# Patient Record
Sex: Male | Born: 1990 | Race: White | Hispanic: No | Marital: Married | Smoking: Current every day smoker
Health system: Southern US, Community
[De-identification: ages and names within clinical notes are randomized; demographics above are authoritative.]

## PROBLEM LIST (undated history)

## (undated) DIAGNOSIS — F419 Anxiety disorder, unspecified: Secondary | ICD-10-CM

## (undated) DIAGNOSIS — F319 Bipolar disorder, unspecified: Secondary | ICD-10-CM

## (undated) DIAGNOSIS — F1910 Other psychoactive substance abuse, uncomplicated: Secondary | ICD-10-CM

## (undated) HISTORY — PX: NO PAST SURGERIES: SHX2092

---
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
## 2009-01-17 ENCOUNTER — Emergency Department: Payer: Self-pay | Admitting: Emergency Medicine

## 2012-06-06 ENCOUNTER — Emergency Department: Payer: Self-pay | Admitting: Emergency Medicine

## 2012-06-06 LAB — CBC
HCT: 41 % (ref 40.0–52.0)
HGB: 14.1 g/dL (ref 13.0–18.0)
HGB: 14.1 g/dL (ref 13.0–18.0)
MCH: 30.2 pg (ref 26.0–34.0)
MCH: 31.2 pg (ref 26.0–34.0)
MCHC: 33.2 g/dL (ref 32.0–36.0)
MCHC: 34.4 g/dL (ref 32.0–36.0)
MCV: 91 fL (ref 80–100)
MCV: 91 fL (ref 80–100)
Platelet: 151 10*3/uL (ref 150–440)
Platelet: 147 10*3/uL — ABNORMAL LOW (ref 150–440)
RBC: 4.68 10*6/uL (ref 4.40–5.90)
RDW: 13.2 % (ref 11.5–14.5)
WBC: 8.5 10*3/uL (ref 3.8–10.6)
WBC: 7.3 10*3/uL (ref 3.8–10.6)

## 2012-06-06 LAB — COMPREHENSIVE METABOLIC PANEL
Alkaline Phosphatase: 124 U/L (ref 50–136)
Anion Gap: 7 (ref 7–16)
Anion Gap: 6 — ABNORMAL LOW (ref 7–16)
BUN: 11 mg/dL (ref 7–18)
Bilirubin,Total: 0.3 mg/dL (ref 0.2–1.0)
Calcium, Total: 8.6 mg/dL (ref 8.5–10.1)
Chloride: 102 mmol/L (ref 98–107)
Chloride: 104 mmol/L (ref 98–107)
Co2: 29 mmol/L (ref 21–32)
Co2: 30 mmol/L (ref 21–32)
Creatinine: 0.79 mg/dL (ref 0.60–1.30)
Creatinine: 0.7 mg/dL (ref 0.60–1.30)
EGFR (African American): >60
EGFR (Non-African Amer.): >60
Glucose: 102 mg/dL — ABNORMAL HIGH (ref 65–99)
Osmolality: 278 (ref 275–301)
Potassium: 3.3 mmol/L — ABNORMAL LOW (ref 3.5–5.1)
Potassium: 3.7 mmol/L (ref 3.5–5.1)
SGOT(AST): 33 U/L (ref 15–37)
SGOT(AST): 32 U/L (ref 15–37)
SGPT (ALT): 40 U/L (ref 12–78)
SGPT (ALT): 40 U/L (ref 12–78)
Sodium: 138 mmol/L (ref 136–145)
Sodium: 140 mmol/L (ref 136–145)
Total Protein: 7.1 g/dL (ref 6.4–8.2)

## 2012-06-06 LAB — URINALYSIS, COMPLETE
Bilirubin,UR: NEGATIVE
Blood: NEGATIVE
Glucose,UR: NEGATIVE mg/dL (ref 0–75)
Ketone: NEGATIVE
Nitrite: NEGATIVE
Ph: 6 (ref 4.5–8.0)
Ph: 6 (ref 4.5–8.0)
Protein: NEGATIVE
RBC,UR: 5 /HPF (ref 0–5)
RBC,UR: 1 /HPF (ref 0–5)
Specific Gravity: 1.018 (ref 1.003–1.030)
Specific Gravity: 1.012 (ref 1.003–1.030)
Squamous Epithelial: NONE SEEN
Squamous Epithelial: 2
WBC UR: 3 /HPF (ref 0–5)

## 2012-06-06 LAB — DRUG SCREEN, URINE
Amphetamines, Ur Screen: NEGATIVE (ref ?–1000)
Benzodiazepine, Ur Scrn: POSITIVE (ref ?–200)
Cannabinoid 50 Ng, Ur ~~LOC~~: POSITIVE (ref ?–50)
Cocaine Metabolite,Ur ~~LOC~~: NEGATIVE (ref ?–300)
MDMA (Ecstasy)Ur Screen: NEGATIVE (ref ?–500)
Methadone, Ur Screen: NEGATIVE (ref ?–300)
Phencyclidine (PCP) Ur S: NEGATIVE (ref ?–25)
Tricyclic, Ur Screen: NEGATIVE (ref ?–1000)

## 2012-06-06 LAB — ETHANOL
Ethanol %: <0.003 % (ref 0.000–0.080)
Ethanol %: <0.003 % (ref 0.000–0.080)
Ethanol: <3 mg/dL

## 2012-06-06 LAB — SALICYLATE LEVEL: Salicylates, Serum: 3.1 mg/dL — ABNORMAL HIGH

## 2012-06-06 LAB — TSH: Thyroid Stimulating Horm: 2.88 u[IU]/mL

## 2012-11-21 ENCOUNTER — Emergency Department: Payer: Self-pay | Admitting: Emergency Medicine

## 2013-02-12 ENCOUNTER — Emergency Department: Payer: Self-pay | Admitting: Emergency Medicine

## 2013-02-12 LAB — COMPREHENSIVE METABOLIC PANEL
Albumin: 4.3 g/dL (ref 3.4–5.0)
Alkaline Phosphatase: 114 U/L (ref 50–136)
Anion Gap: 3 — ABNORMAL LOW (ref 7–16)
BUN: 15 mg/dL (ref 7–18)
Bilirubin,Total: 0.5 mg/dL (ref 0.2–1.0)
Calcium, Total: 9.2 mg/dL (ref 8.5–10.1)
Chloride: 102 mmol/L (ref 98–107)
Creatinine: 0.87 mg/dL (ref 0.60–1.30)
EGFR (African American): >60
EGFR (Non-African Amer.): >60
Osmolality: 273 (ref 275–301)
SGPT (ALT): 32 U/L (ref 12–78)
Sodium: 136 mmol/L (ref 136–145)

## 2013-02-12 LAB — CBC
HGB: 15.9 g/dL (ref 13.0–18.0)
RBC: 5.08 10*6/uL (ref 4.40–5.90)
WBC: 8.2 10*3/uL (ref 3.8–10.6)

## 2013-02-12 LAB — URINALYSIS, COMPLETE
Blood: NEGATIVE
Nitrite: NEGATIVE
Ph: 5 (ref 4.5–8.0)
Protein: 30
Squamous Epithelial: 1

## 2013-02-12 LAB — DRUG SCREEN, URINE
Amphetamines, Ur Screen: POSITIVE (ref ?–1000)
Barbiturates, Ur Screen: NEGATIVE (ref ?–200)
Cannabinoid 50 Ng, Ur ~~LOC~~: POSITIVE (ref ?–50)
MDMA (Ecstasy)Ur Screen: NEGATIVE (ref ?–500)
Methadone, Ur Screen: NEGATIVE (ref ?–300)
Opiate, Ur Screen: POSITIVE (ref ?–300)

## 2013-02-12 LAB — ETHANOL: Ethanol %: <0.003 % (ref 0.000–0.080)

## 2013-02-13 LAB — SALICYLATE LEVEL: Salicylates, Serum: 3.2 mg/dL — ABNORMAL HIGH

## 2013-02-13 LAB — TSH: Thyroid Stimulating Horm: 6.41 u[IU]/mL — ABNORMAL HIGH

## 2013-02-13 LAB — ACETAMINOPHEN LEVEL: Acetaminophen: <2 ug/mL

## 2013-02-22 ENCOUNTER — Emergency Department: Payer: Self-pay | Admitting: Emergency Medicine

## 2013-12-17 ENCOUNTER — Ambulatory Visit: Payer: Self-pay | Admitting: Emergency Medicine

## 2014-06-13 ENCOUNTER — Emergency Department: Payer: Self-pay | Admitting: Emergency Medicine
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
## 2014-07-21 NOTE — Consult Note (Signed)
Brief Consult Note: Diagnosis: MDD, Polysubstance Dependence.   Patient was seen by consultant.   Recommend further assessment or treatment.   Comments: Pt seen for follow up. He admitted here due to snorting Xanax and opioids. He is IVC. He was following [facility] and getting Abilify. He stated that he is currently on Probation for MJ possesion. He stated that he went to [facility] for rehab in the past. he is willing for treatment again. Denied SI/HI or plans.   Plan; Pt is awaiting placement at [facility].  Pt will be started on Prozac 20mg  for depression.  Will continue to monitor.  Electronic Signatures: Rhunette CroftFaheem, Daran Favaro S (MD)  (Signed 11-Mar-14 10:54)  Authored: Brief Consult Note   Last Updated: 11-Mar-14 10:54 by Rhunette CroftFaheem, Royer Cristobal S (MD)

## 2014-07-30 NOTE — Consult Note (Signed)
Brief Consult Note: Diagnosis: anxiety disorder nos.   Patient was seen by consultant.   Consult note dictated.   Discussed with Attending MD.   Comments: Psychiatry: Patient seen and chart reviewed. Patient petitioned by family who claim he expressed suicidal ideation. Patient absolutely denies this and denies any feeling of depression. Expresses positive feelings about his life. Denies abuse of drugs. Does not meet commitment criteria.  Electronic Signatures: Audery Amellapacs, Tyneshia Stivers T (MD)  (Signed 15-Mar-16 15:56)  Authored: Brief Consult Note   Last Updated: 15-Mar-16 15:56 by Audery Amellapacs, Baileigh Modisette T (MD)

## 2014-07-30 NOTE — Consult Note (Signed)
PATIENT NAME:  Benjamin Tyler, Benjamin Tyler MR#:  [id number] DATE OF BIRTH:  10/12/1990  DATE OF CONSULTATION:  06/13/2014  CONSULTING PHYSICIAN:  Audery Amel, MD  IDENTIFYING INFORMATION AND REASON FOR CONSULTATION:  A 24 year old man who was brought in under involuntary petition filed by his family claiming that he was abusing his medication.   CHIEF COMPLAINT:  "I just saw my psychiatrist."   HISTORY OF PRESENT ILLNESS:  Information from the patient and the chart. Commitment paperwork alleges that he has been abusing his psychiatric medicine, specifically his Xanax. It also alleges that he had made suicidal statements. The patient flat-out denies both of these things. He says that his mood is good and has been fine in the recent past. He says that he has been sleeping fine and not having significant anxiety problems. He is taking several psychiatric medicines and says he uses all of them appropriately. He denies abusing any of his medicine. He denies abusing alcohol or drugs. He totally denies many suicidal ideation.   PAST PSYCHIATRIC HISTORY:  The patient has been in the Emergency Room in the past for substance abuse problems. He has a history of substance abuse and also has been involved with a Suboxone clinic in the past. It does not appear that he has ever made a suicide attempt. He is currently seeing a psychiatrist named Dr. Chestine Spore, who is treating him with Remeron, Wellbutrin, Xanax, and recently started him on Seroquel.    SOCIAL HISTORY:  The patient lives with a girlfriend. 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 He will not go into much more detail than that about it.   PAST MEDICAL HISTORY:  Denies any ongoing medical problems.   FAMILY HISTORY:  Denies any family history of mental health or substance abuse problems.   SUBSTANCE ABUSE HISTORY:  Documented past history of abuse of opiates and involvement with a Suboxone clinic. The patient denies that he is abusing his Xanax. He  denies that he is drinking currently. He denies any other substance abuse.   REVIEW OF SYSTEMS:  Essentially all negative. He denies depression. He denies suicidal or homicidal ideation. He denies hallucinations. He denies pain. No physical symptoms.   MENTAL STATUS EXAMINATION:  Reasonably well-groomed young man who looks his stated age. Cooperative with the interview, although very clearly making an effort to convince me that he has no problem. Eye contact is good. Psychomotor activity is normal. Speech is a little bit earnest and intense, but not pressured. It is also slightly slurred in a way that sounds like he may be at least partially intoxicated. He is alert and oriented x 4, however. He can remember 3 words immediately and at 3 minutes. He denies suicidal or homicidal ideation. He denies auditory or visual hallucinations. Judgment and insight about his substance abuse problem is questionable. Intelligence appears to be normal.   LABORATORY RESULTS:  Drug screen is positive for cannabis and benzodiazepines. Urinalysis is unremarkable. Alcohol level is negative. Acetaminophen and salicylates are negative. Chemistry panel is unremarkable. Low platelet count at 137,000. No other abnormalities on the blood count.   VITAL SIGNS:  Blood pressure 149/95, respirations 16, pulse 102, temperature 98.3.   ASSESSMENT:  A 24 year old man with a history of substance abuse problems. Family alleged that he is abusing his medication. What I found on looking up his medicine in the controlled substance database, it appears that his benzodiazepine dose has been doubled in the last month. He had  been receiving Xanax 1 mg 3 times a day for several months, but his most recent prescription was for 2 mg 3 times a day. He does seem slightly intoxicated to me in a way that might be benzodiazepines, but he is not delirious and there is no sign of any acute dangerousness. The patient does not meet commitment criteria.    TREATMENT PLAN:  Discontinue involuntary commitment. Psychoeducation and counseling completed about the dangers of benzodiazepine abuse and the mixture of benzodiazepines with other drugs. Education completed about the addictive qualities of benzodiazepines. The patient was encouraged to talk to his psychiatrist about this. No further treatment recommendations or referral. No medications written.   DIAGNOSIS, PRINCIPAL AND PRIMARY:  AXIS I:  Opiate abuse, moderate, in remission.   SECONDARY DIAGNOSES: AXIS I:  Rule out benzodiazepine abuse; cannabis abuse, mild to moderate.   AXIS II:  Deferred.   AXIS III:  No diagnosis.    ____________________________ Audery AmelJohn T. Clapacs, MD jtc:nb D: 06/13/2014 22:09:35 ET T: 06/13/2014 22:38:47 ET JOB#: [id number]  cc: Audery AmelJohn T. Clapacs, MD, <Dictator> Audery AmelJOHN T CLAPACS MD ELECTRONICALLY SIGNED 06/16/2014 10:13

## 2015-01-22 ENCOUNTER — Encounter: Payer: Self-pay | Admitting: Emergency Medicine

## 2015-01-22 ENCOUNTER — Emergency Department: Payer: BLUE CROSS/BLUE SHIELD

## 2015-01-22 ENCOUNTER — Emergency Department
Admission: EM | Admit: 2015-01-22 | Discharge: 2015-01-22 | Disposition: A | Discharge status: Home / Self Care | Payer: BLUE CROSS/BLUE SHIELD | Attending: Emergency Medicine | Admitting: Emergency Medicine

## 2015-01-22 DIAGNOSIS — F17200 Nicotine dependence, unspecified, uncomplicated: Secondary | ICD-10-CM | POA: Diagnosis not present

## 2015-01-22 DIAGNOSIS — S00502A Unspecified superficial injury of oral cavity, initial encounter: Secondary | ICD-10-CM | POA: Diagnosis not present

## 2015-01-22 DIAGNOSIS — W1839XA Other fall on same level, initial encounter: Secondary | ICD-10-CM | POA: Diagnosis not present

## 2015-01-22 DIAGNOSIS — R569 Unspecified convulsions: Secondary | ICD-10-CM | POA: Insufficient documentation

## 2015-01-22 DIAGNOSIS — Z79899 Other long term (current) drug therapy: Secondary | ICD-10-CM | POA: Diagnosis not present

## 2015-01-22 DIAGNOSIS — Y9389 Activity, other specified: Secondary | ICD-10-CM | POA: Diagnosis not present

## 2015-01-22 DIAGNOSIS — Y990 Civilian activity done for income or pay: Secondary | ICD-10-CM | POA: Insufficient documentation

## 2015-01-22 DIAGNOSIS — S0990XA Unspecified injury of head, initial encounter: Secondary | ICD-10-CM | POA: Insufficient documentation

## 2015-01-22 DIAGNOSIS — Y9289 Other specified places as the place of occurrence of the external cause: Secondary | ICD-10-CM | POA: Insufficient documentation

## 2015-01-22 LAB — URINE DRUG SCREEN, QUALITATIVE (ARMC ONLY)
Amphetamines, Ur Screen: NOT DETECTED — AB
BARBITURATES, UR SCREEN: NOT DETECTED — AB
BENZODIAZEPINE, UR SCRN: POSITIVE — AB
CANNABINOID 50 NG, UR ~~LOC~~: POSITIVE — AB
COCAINE METABOLITE, UR ~~LOC~~: NOT DETECTED — AB
MDMA (Ecstasy)Ur Screen: NOT DETECTED — AB
Methadone Scn, Ur: NOT DETECTED — AB
OPIATE, UR SCREEN: NOT DETECTED — AB
Phencyclidine (PCP) Ur S: NOT DETECTED — AB
TRICYCLIC, UR SCREEN: POSITIVE — AB

## 2015-01-22 LAB — URINALYSIS COMPLETE WITH MICROSCOPIC (ARMC ONLY)
BILIRUBIN URINE: NEGATIVE
CELLULAR CAST UA: 2
Glucose, UA: NEGATIVE mg/dL
HGB URINE DIPSTICK: NEGATIVE
KETONES UR: NEGATIVE mg/dL
Leukocytes, UA: NEGATIVE
Nitrite: NEGATIVE
PH: 5 (ref 5.0–8.0)
Protein, ur: NEGATIVE mg/dL
SPECIFIC GRAVITY, URINE: 1.014 (ref 1.005–1.030)
SQUAMOUS EPITHELIAL / LPF: NONE SEEN

## 2015-01-22 LAB — CBC WITH DIFFERENTIAL/PLATELET
Basophils Absolute: 0 10*3/uL (ref 0–0.1)
Basophils Relative: 1 %
EOS ABS: 0.3 10*3/uL (ref 0–0.7)
Eosinophils Relative: 4 %
HEMATOCRIT: 42.6 % (ref 40.0–52.0)
HEMOGLOBIN: 14.6 g/dL (ref 13.0–18.0)
LYMPHS ABS: 2.4 10*3/uL (ref 1.0–3.6)
Lymphocytes Relative: 31 %
MCH: 30.8 pg (ref 26.0–34.0)
MCHC: 34.3 g/dL (ref 32.0–36.0)
MCV: 89.7 fL (ref 80.0–100.0)
MONO ABS: 0.4 10*3/uL (ref 0.2–1.0)
MONOS PCT: 6 %
NEUTROS PCT: 58 %
Neutro Abs: 4.5 10*3/uL (ref 1.4–6.5)
Platelets: 189 10*3/uL (ref 150–440)
RBC: 4.75 MIL/uL (ref 4.40–5.90)
RDW: 13.4 % (ref 11.5–14.5)
WBC: 7.7 10*3/uL (ref 3.8–10.6)

## 2015-01-22 LAB — BASIC METABOLIC PANEL
Anion gap: 9 (ref 5–15)
BUN: 10 mg/dL (ref 6–20)
CALCIUM: 9.2 mg/dL (ref 8.9–10.3)
CHLORIDE: 105 mmol/L (ref 101–111)
CO2: 26 mmol/L (ref 22–32)
CREATININE: 0.84 mg/dL (ref 0.61–1.24)
GFR calc non Af Amer: >60 mL/min (ref >60)
GLUCOSE: 105 mg/dL — AB (ref 65–99)
Potassium: 3.6 mmol/L (ref 3.5–5.1)
Sodium: 140 mmol/L (ref 135–145)

## 2015-01-22 LAB — MAGNESIUM: Magnesium: 2 mg/dL (ref 1.7–2.4)

## 2015-01-22 NOTE — ED Notes (Signed)
Called lab to inquire about the UDS that was collected at 16:59. Lab reports the urine is still on the analyzer and results should be available very soon. Huel CoteQuigley, MD made aware.

## 2015-01-22 NOTE — ED Provider Notes (Signed)
Time Seen: Approximately 1718 I have reviewed the triage notes  Chief Complaint: Headache   History of Present Illness: Benjamin Tyler is a 24 y.o. male who states he felt fine throughout most the day. He was working outside on a machine at his grandparent's home. Patient apparently was witnessed to fall backwards onto the ground and had some "" shaking "". Patient states he doesn't feel he was unconscious for a long period of time. He denies any neck pain though has bilateral temporal pain type headache. He describes the headache is mild. He did bite his tongue and has some mild crush injuries on both sides of his tongue. He did not urinate or defecate on himself. He states he smoked marijuana couple of days ago but no other illicit drugs. Patient denies any alcohol. He has a history of possible medication abuse with his prescription meds. States he's been taking his medications as prescribed. He denies any focal weakness or any other discomfort across the shoulder or hips.   History reviewed. No pertinent past medical history. No history of seizure disorder There are no active problems to display for this patient.   History reviewed. No pertinent past surgical history.  History reviewed. No pertinent past surgical history.  Current Outpatient Rx  Name  Route  Sig  Dispense  Refill  . ALPRAZolam (XANAX) 0.25 MG tablet   Oral   Take 2 mg by mouth 3 (three) times daily.         Marland Kitchen buPROPion (WELLBUTRIN XL) 300 MG 24 hr tablet   Oral   Take 300 mg by mouth daily.           Allergies:  Review of patient's allergies indicates no known allergies.  Family History: No family history on file.  Social History: Social History  Substance Use Topics  . Smoking status: Current Every Day Smoker -- 1.50 packs/day  . Smokeless tobacco: Never Used  . Alcohol Use: No     Review of Systems:   10 point review of systems was performed and was otherwise negative:  Constitutional:  No fever. Patient states he feels dehydrated. Eyes: No visual disturbances ENT: No sore throat, ear pain Cardiac: No chest pain Respiratory: No shortness of breath, wheezing, or stridor Abdomen: No abdominal pain, no vomiting, No diarrhea Endocrine: No weight loss, No night sweats Extremities: No peripheral edema, cyanosis Skin: No rashes, easy bruising Neurologic: No focal weakness, trouble with speech or swollowing Urologic: No dysuria, Hematuria, or urinary frequency *  Physical Exam:  ED Triage Vitals  Enc Vitals Group     BP 01/22/15 1702 132/94 mmHg     Pulse Rate 01/22/15 1702 102     Resp 01/22/15 1702 18     Temp --      Temp src --      SpO2 01/22/15 1702 100 %     Weight 01/22/15 1702 150 lb (68.04 kg)     Height 01/22/15 1702  (1.854 m)     Head Cir --      Peak Flow --      Pain Score 01/22/15 1704 10     Pain Loc --      Pain Edu? --      Excl. in GC? --     General: Awake , Alert , and Oriented times 3; GCS 15 Head: Normal cephalic , atraumatic Eyes: Pupils equal , round, reactive to light Nose/Throat: No nasal drainage, patent upper airway without erythema  or exudate.  Neck: Supple, Full range of motion, No anterior adenopathy or palpable thyroid masses Lungs: Clear to ascultation without wheezes , rhonchi, or rales Heart: Regular rate, regular rhythm without murmurs , gallops , or rubs Abdomen: Soft, non tender without rebound, guarding , or rigidity; bowel sounds positive and symmetric in all 4 quadrants. No organomegaly .        Extremities: 2 plus symmetric pulses. No edema, clubbing or cyanosis Neurologic: normal ambulation, Motor symmetric without deficits, sensory intact Skin: warm, dry, no rashes   Labs:   All laboratory work was reviewed including any pertinent negatives or positives listed below:  Labs Reviewed  BASIC METABOLIC PANEL  CBC WITH DIFFERENTIAL/PLATELET  URINE DRUG SCREEN, QUALITATIVE ([facility] ONLY)  URINALYSIS COMPLETEWITH  MICROSCOPIC ([facility] ONLY)  MAGNESIUM    EKG:  ED ECG REPORT I, Jennye MoccasinBrian S Quigley, the attending physician, personally viewed and interpreted this ECG.  Date: 01/22/2015 EKG Time: 1710 Rate: 81 Rhythm: normal sinus rhythm QRS Axis: normal Intervals: normal ST/T Wave abnormalities: normal Conduction Disutrbances: none Narrative Interpretation: unremarkable Normal EKG   Radiology:    EXAM: CT HEAD WITHOUT CONTRAST  TECHNIQUE: Contiguous axial images were obtained from the base of the skull through the vertex without contrast.  COMPARISON: Head CT 02/12/2013  FINDINGS: No evidence for acute hemorrhage, mass lesion, midline shift, hydrocephalus or large infarct. There is extensive mucosal thickening in the ethmoid air cells. Mucosal disease in the right maxillary sinus which is incompletely evaluated. No evidence for an acute calvarial fracture.  IMPRESSION: No acute intracranial abnormality.  Paranasal sinus disease.   Electronically Signed By: Richarda OverlieAdam Henn M.D. On: 01/22/2015 18:33  I personally reviewed the radiologic studies   ED Course: Given the patient's current clinical presentation, objective findings, etc,; this appeared to be new onset seizure disorder which did not seem to be complicated nature. The patient require further outpatient assessment by a neurologist and was advised he cannot drive or operate heavy machinery that would put himself or do activities that would put himself or others in danger . We discussed further outpatient workup which make constitute an EEG, MRI, etc. They're advised to return here if patient has a repeat seizure especially one that last more than 3 minutes and/or 2 seizures without full recovery within a 30 minute span. All questions and concerns were addressed at the bedside with family members present.    Assessment:  New-onset seizure     Plan: Outpatient management Patient was advised to return immediately if  condition worsens. Patient was advised to follow up with her primary care physician or other specialized physicians involved and in their current assessment.            Jennye MoccasinBrian S Quigley, MD 01/22/15 2204
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
## 2015-01-22 NOTE — ED Notes (Signed)
AAOx3.  Skin warm and dry.  NAD 

## 2015-01-22 NOTE — Discharge Instructions (Signed)
Seizure, Adult A seizure is abnormal electrical activity in the brain. Seizures usually last from 30 seconds to 2 minutes. There are various types of seizures. Before a seizure, you may have a warning sensation (aura) that a seizure is about to occur. An aura may include the following symptoms:   Fear or anxiety.  Nausea.  Feeling like the room is spinning (vertigo).  Vision changes, such as seeing flashing lights or spots. Common symptoms during a seizure include:  A change in attention or behavior (altered mental status).  Convulsions with rhythmic jerking movements.  Drooling.  Rapid eye movements.  Grunting.  Loss of bladder and bowel control.  Bitter taste in the mouth.  Tongue biting. After a seizure, you may feel confused and sleepy. You may also have an injury resulting from convulsions during the seizure. HOME CARE INSTRUCTIONS   If you are given medicines, take them exactly as prescribed by your health care provider.  Keep all follow-up appointments as directed by your health care provider.  Do not swim or drive or engage in risky activity during which a seizure could cause further injury to you or others until your health care provider says it is OK.  Get adequate rest.  Teach friends and family what to do if you have a seizure. They should:  Lay you on the ground to prevent a fall.  Put a cushion under your head.  Loosen any tight clothing around your neck.  Turn you on your side. If vomiting occurs, this helps keep your airway clear.  Stay with you until you recover.  Know whether or not you need emergency care. SEEK IMMEDIATE MEDICAL CARE IF:  The seizure lasts longer than 5 minutes.  The seizure is severe or you do not wake up immediately after the seizure.  You have an altered mental status after the seizure.  You are having more frequent or worsening seizures. Someone should drive you to the emergency department or call local emergency  services (911 in [place] MAKE SURE YOU:  Understand these instructions.  Will watch your condition.  Will get help right away if you are not doing well or get worse.   This information is not intended to replace advice given to you by your health care provider. Make sure you discuss any questions you have with your health care provider.   Document Released: 03/14/2000 Document Revised: 04/07/2014 Document Reviewed: 10/27/2012 Elsevier Interactive Patient Education [employer]  Please return immediately if condition worsens. Please contact her primary physician or the physician you were given for referral. If you have any specialist physicians involved in her treatment and plan please also contact them. Thank you for using [facility]. Over-the-counter pain medication. Please follow-up with a neurologist and no driving or operating any machinery that would put you were others in danger if the seizure would reoccur. Please drink plenty of fluids and get enough sleep. Return to emergency department if he had 2 seizures within a 30 minute period of time especially if he did not return to normal consciousness or any seizure lasts more than 3 minutes.

## 2015-01-22 NOTE — ED Notes (Signed)
Called lab again to inquire about UDS progress. Lab reports all results are back except for one, which should take about 10 more minutes. Md made aware.

## 2015-07-23 ENCOUNTER — Encounter: Payer: Self-pay | Admitting: Emergency Medicine

## 2015-07-23 ENCOUNTER — Emergency Department
Admission: EM | Admit: 2015-07-23 | Discharge: 2015-07-23 | Disposition: A | Discharge status: Home / Self Care | Payer: BLUE CROSS/BLUE SHIELD | Attending: Emergency Medicine | Admitting: Emergency Medicine

## 2015-07-23 ENCOUNTER — Emergency Department: Payer: BLUE CROSS/BLUE SHIELD

## 2015-07-23 DIAGNOSIS — R4189 Other symptoms and signs involving cognitive functions and awareness: Secondary | ICD-10-CM

## 2015-07-23 DIAGNOSIS — R400 Somnolence: Secondary | ICD-10-CM

## 2015-07-23 DIAGNOSIS — F1290 Cannabis use, unspecified, uncomplicated: Secondary | ICD-10-CM

## 2015-07-23 DIAGNOSIS — F17200 Nicotine dependence, unspecified, uncomplicated: Secondary | ICD-10-CM | POA: Diagnosis not present

## 2015-07-23 DIAGNOSIS — F1210 Cannabis abuse, uncomplicated: Secondary | ICD-10-CM | POA: Insufficient documentation

## 2015-07-23 DIAGNOSIS — R4781 Slurred speech: Secondary | ICD-10-CM | POA: Insufficient documentation

## 2015-07-23 DIAGNOSIS — R4020 Unspecified coma: Secondary | ICD-10-CM | POA: Diagnosis not present

## 2015-07-23 LAB — URINALYSIS COMPLETE WITH MICROSCOPIC (ARMC ONLY)
BACTERIA UA: NONE SEEN
BILIRUBIN URINE: NEGATIVE
GLUCOSE, UA: NEGATIVE mg/dL
HGB URINE DIPSTICK: NEGATIVE
KETONES UR: NEGATIVE mg/dL
LEUKOCYTES UA: NEGATIVE
NITRITE: NEGATIVE
Protein, ur: NEGATIVE mg/dL
SPECIFIC GRAVITY, URINE: 1.003 — AB (ref 1.005–1.030)
Squamous Epithelial / LPF: NONE SEEN
pH: 6 (ref 5.0–8.0)

## 2015-07-23 LAB — ETHANOL: Alcohol, Ethyl (B): <5 mg/dL (ref <5)

## 2015-07-23 LAB — COMPREHENSIVE METABOLIC PANEL
ALBUMIN: 4.6 g/dL (ref 3.5–5.0)
ALT: 17 U/L (ref 17–63)
ANION GAP: 7 (ref 5–15)
AST: 22 U/L (ref 15–41)
Alkaline Phosphatase: 83 U/L (ref 38–126)
BUN: 11 mg/dL (ref 6–20)
CALCIUM: 9.5 mg/dL (ref 8.9–10.3)
CHLORIDE: 106 mmol/L (ref 101–111)
CO2: 30 mmol/L (ref 22–32)
Creatinine, Ser: 0.77 mg/dL (ref 0.61–1.24)
GFR calc Af Amer: >60 mL/min (ref >60)
GFR calc non Af Amer: >60 mL/min (ref >60)
GLUCOSE: 98 mg/dL (ref 65–99)
POTASSIUM: 3.7 mmol/L (ref 3.5–5.1)
Sodium: 143 mmol/L (ref 135–145)
Total Bilirubin: 0.4 mg/dL (ref 0.3–1.2)
Total Protein: 7.9 g/dL (ref 6.5–8.1)

## 2015-07-23 LAB — CBC
HCT: 45 % (ref 40.0–52.0)
HEMOGLOBIN: 15.2 g/dL (ref 13.0–18.0)
MCH: 30.9 pg (ref 26.0–34.0)
MCHC: 33.9 g/dL (ref 32.0–36.0)
MCV: 91.3 fL (ref 80.0–100.0)
Platelets: 188 10*3/uL (ref 150–440)
RBC: 4.93 MIL/uL (ref 4.40–5.90)
RDW: 13.4 % (ref 11.5–14.5)
WBC: 13.2 10*3/uL — ABNORMAL HIGH (ref 3.8–10.6)

## 2015-07-23 LAB — URINE DRUG SCREEN, QUALITATIVE (ARMC ONLY)
Amphetamines, Ur Screen: NOT DETECTED
BARBITURATES, UR SCREEN: NOT DETECTED
Benzodiazepine, Ur Scrn: POSITIVE — AB
COCAINE METABOLITE, UR ~~LOC~~: NOT DETECTED
Cannabinoid 50 Ng, Ur ~~LOC~~: POSITIVE — AB
MDMA (Ecstasy)Ur Screen: NOT DETECTED
METHADONE SCREEN, URINE: NOT DETECTED
Opiate, Ur Screen: NOT DETECTED
Phencyclidine (PCP) Ur S: NOT DETECTED
TRICYCLIC, UR SCREEN: NOT DETECTED

## 2015-07-23 LAB — SALICYLATE LEVEL

## 2015-07-23 LAB — ACETAMINOPHEN LEVEL

## 2015-07-23 MED ORDER — SODIUM CHLORIDE 0.9 % IV BOLUS (SEPSIS)
1000.0000 mL | Freq: Once | INTRAVENOUS | Status: AC
  Administered 2015-07-23: 1000 mL via INTRAVENOUS

## 2015-07-23 NOTE — ED Notes (Signed)
Pt refused to sign.  

## 2015-07-23 NOTE — ED Notes (Signed)
Water given to pt per Dr. Sharma CovertNorman.

## 2015-07-23 NOTE — ED Provider Notes (Signed)
[facility] Emergency Department Provider Note  ____________________________________________  Time seen: Approximately 5:39 PM  I have reviewed the triage vital signs and the nursing notes.   HISTORY  Chief Complaint Drug Overdose    HPI Benjamin ReekJustin Dean Tyler is a 25 y.o. male with a history of cocaine abuse, benzodiazepine abuse, bipolar disorder and schizophrenia brought by EMS for unresponsiveness. The patient reports that he had [place] [employer] and was sitting in his car when he had "seizure." He states that he had no preceding symptoms but then he woke up and was in the ambulance." This is just like the last MI a seizure." He was seen here 10/16 with a brief episode of unresponsiveness and some possible shaking but never followed up with neurology as recommended by the emergency physician. Is not had any other episodes since then. He reports that he is taking all of his medications as prescribed and took only 2 mg of Xanax this morning. He denies any other drug or alcohol use, he denies SI, HI or hallucinations.He denies any recent illness. At this time, he notes that he has some slurred speech which he states is unusual for him.   History reviewed. No pertinent past medical history.  There are no active problems to display for this patient.   History reviewed. No pertinent past surgical history.  Current Outpatient Rx  Name  Route  Sig  Dispense  Refill  . ALPRAZolam (XANAX) 0.25 MG tablet   Oral   Take 2 mg by mouth 3 (three) times daily.         Marland Kitchen. buPROPion (WELLBUTRIN XL) 300 MG 24 hr tablet   Oral   Take 300 mg by mouth daily.           Allergies Review of patient's allergies indicates no known allergies.  History reviewed. No pertinent family history.  Social History Social History  Substance Use Topics  . Smoking status: Current Every Day Smoker -- 1.50 packs/day  . Smokeless tobacco: Never Used  . Alcohol Use: No     Review of Systems Constitutional: No fever/chills.Positive syncope. Negative trauma. Eyes: No visual changes. No blurred or double vision. ENT: No sore throat. No congestion or rhinorrhea. Cardiovascular: Denies chest pain. Denies palpitations. Respiratory: Denies shortness of breath.  No cough. Gastrointestinal: No abdominal pain.  No nausea, no vomiting.  No diarrhea.  No constipation. Genitourinary: Negative for dysuria. Musculoskeletal: Negative for back pain. Skin: Negative for rash. Neurological: Negative for headaches. No focal numbness, tingling or weakness. Positive slurred speech. Positive subjective seizure.  10-point ROS otherwise negative.  ____________________________________________   PHYSICAL EXAM:  VITAL SIGNS: ED Triage Vitals  Enc Vitals Group     BP 07/23/15 1701 128/90 mmHg     Pulse Rate 07/23/15 1701 81     Resp 07/23/15 1701 14     Temp 07/23/15 1701 97.5 F (36.4 C)     Temp Source 07/23/15 1701 Oral     SpO2 07/23/15 1701 100 %     Weight --      Height --      Head Cir --      Peak Flow --      Pain Score 07/23/15 1702 0     Pain Loc --      Pain Edu? --      Excl. in GC? --     Constitutional: Patient is alert and oriented, he is answering questions appropriately. He has slurred speech  and appears somewhat somnolent but is not falling asleep.  Eyes: Conjunctivae are normal.  EOMI. No scleral icterus. PERRLA. Head: Atraumatic. No raccoon eyes or Battle sign. Nose: No congestion/rhinnorhea. No swelling over the nose or septal hematoma. Mouth/Throat: Mucous membranes are moist. Dental injury or malocclusion. Neck: No stridor.  Supple.  No meningismus. Full range of motion without pain. Cardiovascular: Normal rate, regular rhythm. No murmurs, rubs or gallops.  Respiratory: Normal respiratory effort.  No accessory muscle use or retractions. Lungs CTAB.  No wheezes, rales or ronchi. Gastrointestinal: Soft, nontender and nondistended.  No  guarding or rebound.  No peritoneal signs. Musculoskeletal: No LE edema. No ttp in the calves or palpable cords.  Negative Homan's sign. Neurologic: Alert and oriented 3. Speech is slurred.  Face and smile symmetric.  No pronator drift. 5 out of 5 grip, biceps, triceps, hip flexors, plantar flexion and dorsiflexion. Normal sensation to light touch in the bilateral upper and lower extremities, and face.  Skin:  Skin is warm, dry and intact. No rash noted. Psychiatric: Patient has a bizarre affect but normal mood. He denies SI, HI or hallucinations. He has exhibited normal judgment.  ____________________________________________   LABS (all labs ordered are listed, but only abnormal results are displayed)  Labs Reviewed  ACETAMINOPHEN LEVEL - Abnormal; Notable for the following:    Acetaminophen (Tylenol), Serum <10 (*)    All other components within normal limits  CBC - Abnormal; Notable for the following:    WBC 13.2 (*)    All other components within normal limits  URINE DRUG SCREEN, QUALITATIVE ([facility] ONLY) - Abnormal; Notable for the following:    Cannabinoid 50 Ng, Ur [place] POSITIVE (*)    Benzodiazepine, Ur Scrn POSITIVE (*)    All other components within normal limits  URINALYSIS COMPLETEWITH MICROSCOPIC ([facility] ONLY) - Abnormal; Notable for the following:    Color, Urine STRAW (*)    APPearance CLEAR (*)    Specific Gravity, Urine 1.003 (*)    All other components within normal limits  COMPREHENSIVE METABOLIC PANEL  ETHANOL  SALICYLATE LEVEL   ____________________________________________  EKG  ED ECG REPORT I, Rockne Menghini, the attending physician, personally viewed and interpreted this ECG.   Date: 07/23/2015  EKG Time: 1701  Rate: 85  Rhythm: normal sinus rhythm  Axis: Normal  Intervals:none  ST&T Change: Nonspecific T-wave inversions in V1. No ST elevation.  ____________________________________________  RADIOLOGY  No results  found.  ____________________________________________   PROCEDURES  Procedure(s) performed: None  Critical Care performed: No ____________________________________________   INITIAL IMPRESSION / ASSESSMENT AND PLAN / ED COURSE  Pertinent labs & imaging results that were available during my care of the patient were reviewed by me and considered in my medical decision making (see chart for details).  25 y.o. male with a history of polysubstance abuse, multiple psychiatric illnesses, and a shaking episode of unknown etiology several months ago presenting with loss of consciousness, now with resulting slurred speech. On my exam, do not see any additional neurologic deficits other than the slurred speech. Likely etiologies include substance abuse, although postictal state is possible. Unfortunately the patient never did follow up with neurology for further testing regarding seizures. Today, his episode was unwitnessed so is unclear whether he had any tonic-clonic activity or other indicators that would be suggestive of seizure. We will check his labs, evaluate him for drug use, and reevaluate the patient for final disposition.  ----------------------------------------- 7:18 PM on 07/23/2015 -----------------------------------------  The patient remains hemodynamically  stable. His workup has been reassuring with laboratory studies that are reassuring except for a white blood count of 13.2 which is nonspecific. His Tylenol and salicylate levels, and ethanol levels, are negative. The patient has refused CT head. Until now, he is also refused giving a urine test. We'll give the patient some water to drink and see if he is able to provide a urine sample.  I have had an extensive conversation with the patient, his mother and his sister about neurologic follow-up and refraining from driving until he is cleared by a neurologist to do so. It is possible that his episode today was related to seizures,  although I am still concerned about possible polysubstance abuse.  ----------------------------------------- 9:43 PM on 07/23/2015 -----------------------------------------  While speaking with the patient, he became very confrontational and aggressive. He was angry and swearing at me while I was describing to him why I wanted to complete the evaluation. He has been brought to my attention that he may have left prior to treatment completion, but I have printed his discharge paperwork including the follow-up information with the neurologist if he would like to do so. ____________________________________________  FINAL CLINICAL IMPRESSION(S) / ED DIAGNOSES  Final diagnoses:  Unresponsiveness  Slurred speech  Somnolence  Marijuana use      NEW MEDICATIONS STARTED DURING THIS VISIT:  New Prescriptions   No medications on file     Rockne Menghini, MD 07/23/15 2144

## 2015-07-23 NOTE — Discharge Instructions (Signed)
Please make an appointment with the neurologist for further evaluation of your episodes.  Please do not take any illicit or prescription drugs that are not prescribed to you were in the manner that they're prescribed.  Do not drive or operate heavy machinery until you've been cleared by the neurologist to do so.  Return to the emergency department if you develop fainting, unresponsiveness, headache, numbness tingling or weakness, or any other symptoms concerning to you.

## 2015-07-23 NOTE — ED Notes (Signed)
Pt refused CT  

## 2015-07-23 NOTE — ED Notes (Signed)
Urine not collected at 1742

## 2015-08-23 ENCOUNTER — Other Ambulatory Visit: Payer: Self-pay | Admitting: Neurology

## 2015-08-23 DIAGNOSIS — R55 Syncope and collapse: Secondary | ICD-10-CM

## 2015-09-10 ENCOUNTER — Emergency Department
Admission: EM | Admit: 2015-09-10 | Discharge: 2015-09-10 | Disposition: A | Discharge status: Home / Self Care | Payer: BLUE CROSS/BLUE SHIELD | Attending: Emergency Medicine | Admitting: Emergency Medicine

## 2015-09-10 ENCOUNTER — Encounter: Payer: Self-pay | Admitting: Emergency Medicine

## 2015-09-10 DIAGNOSIS — F1210 Cannabis abuse, uncomplicated: Secondary | ICD-10-CM

## 2015-09-10 DIAGNOSIS — F1910 Other psychoactive substance abuse, uncomplicated: Secondary | ICD-10-CM | POA: Diagnosis present

## 2015-09-10 DIAGNOSIS — F17200 Nicotine dependence, unspecified, uncomplicated: Secondary | ICD-10-CM | POA: Insufficient documentation

## 2015-09-10 HISTORY — DX: Anxiety disorder, unspecified: F41.9

## 2015-09-10 LAB — CBC
HEMATOCRIT: 43.3 % (ref 40.0–52.0)
HEMOGLOBIN: 14.4 g/dL (ref 13.0–18.0)
MCH: 30.1 pg (ref 26.0–34.0)
MCHC: 33.3 g/dL (ref 32.0–36.0)
MCV: 90.4 fL (ref 80.0–100.0)
Platelets: 164 10*3/uL (ref 150–440)
RBC: 4.79 MIL/uL (ref 4.40–5.90)
RDW: 13.7 % (ref 11.5–14.5)
WBC: 6.2 10*3/uL (ref 3.8–10.6)

## 2015-09-10 LAB — COMPREHENSIVE METABOLIC PANEL
ALBUMIN: 4.3 g/dL (ref 3.5–5.0)
ALK PHOS: 92 U/L (ref 38–126)
ALT: 17 U/L (ref 17–63)
AST: 18 U/L (ref 15–41)
Anion gap: 5 (ref 5–15)
BILIRUBIN TOTAL: 0.4 mg/dL (ref 0.3–1.2)
BUN: 6 mg/dL (ref 6–20)
CHLORIDE: 106 mmol/L (ref 101–111)
CO2: 30 mmol/L (ref 22–32)
Calcium: 9.5 mg/dL (ref 8.9–10.3)
Creatinine, Ser: 0.77 mg/dL (ref 0.61–1.24)
GFR calc non Af Amer: >60 mL/min (ref >60)
Glucose, Bld: 107 mg/dL — ABNORMAL HIGH (ref 65–99)
POTASSIUM: 4.3 mmol/L (ref 3.5–5.1)
Sodium: 141 mmol/L (ref 135–145)
Total Protein: 7.1 g/dL (ref 6.5–8.1)

## 2015-09-10 LAB — URINE DRUG SCREEN, QUALITATIVE (ARMC ONLY)
AMPHETAMINES, UR SCREEN: NOT DETECTED
Barbiturates, Ur Screen: NOT DETECTED
Benzodiazepine, Ur Scrn: POSITIVE — AB
COCAINE METABOLITE, UR ~~LOC~~: NOT DETECTED
Cannabinoid 50 Ng, Ur ~~LOC~~: POSITIVE — AB
MDMA (ECSTASY) UR SCREEN: NOT DETECTED
METHADONE SCREEN, URINE: NOT DETECTED
OPIATE, UR SCREEN: NOT DETECTED
Phencyclidine (PCP) Ur S: NOT DETECTED
Tricyclic, Ur Screen: NOT DETECTED

## 2015-09-10 LAB — ETHANOL

## 2015-09-10 NOTE — ED Provider Notes (Addendum)
Time Seen: Approximately 1320 I have reviewed the triage notes  Chief Complaint: Addiction Problem   History of Present Illness: Benjamin Tyler is a 25 y.o. male who presents after he is been involuntarily committed through the police department by his mother. Patient is placed under IVC paperwork because of suspected drug abuse. Patient currently denies any suicidal thoughts, homicidal thoughts, or hallucinations. He denies any physical complaints. He states the last time he used illicit drugs was approximately a week ago.   Past Medical History  Diagnosis Date  . Anxiety     There are no active problems to display for this patient.   History reviewed. No pertinent past surgical history.  History reviewed. No pertinent past surgical history.  Current Outpatient Rx  Name  Route  Sig  Dispense  Refill  . ALPRAZolam (XANAX) 0.25 MG tablet   Oral   Take 2 mg by mouth 3 (three) times daily.         Marland Kitchen buPROPion (WELLBUTRIN XL) 300 MG 24 hr tablet   Oral   Take 300 mg by mouth daily.           Allergies:  Review of patient's allergies indicates no known allergies.  Family History: No family history on file.  Social History: Social History  Substance Use Topics  . Smoking status: Current Every Day Smoker -- 1.50 packs/day  . Smokeless tobacco: Never Used  . Alcohol Use: No     Review of Systems:   10 point review of systems was performed and was otherwise negative:  Constitutional: No fever Eyes: No visual disturbances ENT: No sore throat, ear pain Cardiac: No chest pain Respiratory: No shortness of breath, wheezing, or stridor Abdomen: No abdominal pain, no vomiting, No diarrhea Endocrine: No weight loss, No night sweats Extremities: No peripheral edema, cyanosis Skin: No rashes, easy bruising Neurologic: No focal weakness, trouble with speech or swollowing Urologic: No dysuria, Hematuria, or urinary frequency   Physical Exam:  ED Triage Vitals   Enc Vitals Group     BP 09/10/15 1133 122/86 mmHg     Pulse Rate 09/10/15 1133 84     Resp 09/10/15 1133 18     Temp 09/10/15 1133 98.3 F (36.8 C)     Temp Source 09/10/15 1133 Oral     SpO2 09/10/15 1133 99 %     Weight 09/10/15 1133 150 lb (68.04 kg)     Height 09/10/15 1133  (1.854 m)     Head Cir --      Peak Flow --      Pain Score --      Pain Loc --      Pain Edu? --      Excl. in GC? --     General: Awake , Alert , and Oriented times 3; GCS 15 Head: Normal cephalic , atraumatic Eyes: Pupils equal , round, reactive to light Nose/Throat: No nasal drainage, patent upper airway without erythema or exudate.  Neck: Supple, Full range of motion, No anterior adenopathy or palpable thyroid masses Lungs: Clear to ascultation without wheezes , rhonchi, or rales Heart: Regular rate, regular rhythm without murmurs , gallops , or rubs Abdomen: Soft, non tender without rebound, guarding , or rigidity; bowel sounds positive and symmetric in all 4 quadrants. No organomegaly .        Extremities: 2 plus symmetric pulses. No edema, clubbing or cyanosis Neurologic: normal ambulation, Motor symmetric without deficits, sensory intact Skin: warm, dry,  no rashes   Labs:   All laboratory work was reviewed including any pertinent negatives or positives listed below:  Labs Reviewed  COMPREHENSIVE METABOLIC PANEL - Abnormal; Notable for the following:    Glucose, Bld 107 (*)    All other components within normal limits  URINE DRUG SCREEN, QUALITATIVE ([facility] ONLY) - Abnormal; Notable for the following:    Cannabinoid 50 Ng, Ur [place] POSITIVE (*)    Benzodiazepine, Ur Scrn POSITIVE (*)    All other components within normal limits  ETHANOL  CBC       ED Course:  Patient's stay here was far as been uneventful. I have consulted TTS and psychiatry to see and evaluate the patient. Further disposition and management depends upon their evaluation. Patient certainly at this time is not  actively suicidal.  Assessment: Illicit drug abuse      Plan: TTS and psychiatry consultations   Patient was seen and evaluated by Dr.Claypacs from psychiatry and felt the patient did not require psychiatric observation and her inpatient management. I certainly agree with his assessment and the patient was discharged to follow up for her outside drug treatment.         Jennye MoccasinBrian S Quigley, MD 09/10/15 1330  Jennye MoccasinBrian S Quigley, MD 09/10/15 [phone]

## 2015-09-10 NOTE — Discharge Instructions (Signed)
Polysubstance Abuse °When people abuse more than one drug or type of drug it is called polysubstance or polydrug abuse. For example, many smokers also drink alcohol. This is one form of polydrug abuse. Polydrug abuse also refers to the use of a drug to counteract an unpleasant effect produced by another drug. It may also be used to help with withdrawal from another drug. People who take stimulants may become agitated. Sometimes this agitation is countered with a tranquilizer. This helps protect against the unpleasant side effects. Polydrug abuse also refers to the use of different drugs at the same time.  °Anytime drug use is interfering with normal living activities, it has become abuse. This includes problems with family and friends. Psychological dependence has developed when your mind tells you that the drug is needed. This is usually followed by physical dependence which has developed when continuing increases of drug are required to get the same feeling or "high". This is known as addiction or chemical dependency. A person's risk is much higher if there is a history of chemical dependency in the family. °SIGNS OF CHEMICAL DEPENDENCY °· You have been told by friends or family that drugs have become a problem. °· You fight when using drugs. °· You are having blackouts (not remembering what you do while using). °· You feel sick from using drugs but continue using. °· You lie about use or amounts of drugs (chemicals) used. °· You need chemicals to get you going. °· You are suffering in work performance or in school because of drug use. °· You get sick from use of drugs but continue to use anyway. °· You need drugs to relate to people or feel comfortable in social situations. °· You use drugs to forget problems. °"Yes" answered to any of the above signs of chemical dependency indicates there are problems. The longer the use of drugs continues, the greater the problems will become. °If there is a family history of  drug or alcohol use, it is best not to experiment with these drugs. Continual use leads to tolerance. After tolerance develops more of the drug is needed to get the same feeling. This is followed by addiction. With addiction, drugs become the most important part of life. It becomes more important to take drugs than participate in the other usual activities of life. This includes relating to friends and family. Addiction is followed by dependency. Dependency is a condition where drugs are now needed not just to get high, but to feel normal. °Addiction cannot be cured but it can be stopped. This often requires outside help and the care of professionals. Treatment centers are listed in the yellow pages under: Cocaine, Narcotics, and Alcoholics Anonymous. Most hospitals and clinics can refer you to a specialized care center. Talk to your caregiver if you need help. °  °This information is not intended to replace advice given to you by your health care provider. Make sure you discuss any questions you have with your health care provider. °  °Document Released: 11/06/2004 Document Revised: 06/09/2011 Document Reviewed: 03/22/2014 °Elsevier Interactive Patient Education ©2016 [employer] ° °

## 2015-09-10 NOTE — ED Notes (Signed)
Pt is involuntarily commitment due to reported harmful to self from substance abuse.  Pt denies using illegal drugs, alcohol, but states that he takes seroquel, alprazolam and mertazipine, but states he uses them exactly as prescribed.

## 2015-09-10 NOTE — Consult Note (Signed)
[facility] Face-to-Face Psychiatry Consult   Reason for Consult:  Consult for this 25 year old man brought to the hospital under involuntary commitment filed by his family Referring Physician:  Marcelene Butte Patient Identification: Benjamin Tyler MRN:  [id number] Principal Diagnosis: Marijuana abuse Diagnosis:   Patient Active Problem List   Diagnosis Date Noted  . Marijuana abuse [F12.10] 09/10/2015    Total Time spent with patient: 1 hour  Subjective:   Benjamin Tyler is a 25 y.o. male patient admitted with "I don't know why I'm here. I guess they called the police.".  HPI:  Patient interviewed. Chart reviewed. Laboratories and vital signs reviewed. Old chart reviewed. 25 year old man brought in under involuntary commitment filed by his family. The commitment paperwork says that the patient overdosed and had to be revived with Narcan and that the family found bags of methamphetamine in his car. On interview today the patient says he has no idea why anyone had him brought to the hospital. He says that he was at his sister's house just sitting there when the police came and picked him up. Patient says his mood has been fine recently. Denies any sleep problems denies any appetite problems denies any mood problems. Absolutely denies any suicidal thoughts at all. Patient says that he knows that his family think that he is on drugs. He denies that he's been abusing any drugs recently. He says that he takes his prescribed medicines from his psychiatrist which are Seroquel, mirtazapine, and Xanax. Denies that he is abusing them. Patient denies that he has had any recent overdoses.  Social history: Patient lives near his family. 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 Says he sees his family on a daily basis. Not married.  Medical history: No active ongoing medical problems.  Substance abuse history: Patient has a past history of substance abuse in his family has brought him into the emergency room under  similar circumstances in the past. Patient denies that he is using any drugs or alcohol currently. He does say that he goes for regular counseling to see his outpatient doctor for his past mood problems. His drug screen is positive only for benzodiazepines consistent with Xanax and cannabis.  Past Psychiatric History: Patient has had a history of mood instability in the past. Currently sees an outpatient psychiatrist and take Seroquel Remeron and Xanax. Sees a therapist regularly. Denies he's ever tried to kill himself in the past.  Risk to Self: Is patient at risk for suicide?: No Risk to Others:   Prior Inpatient Therapy:   Prior Outpatient Therapy:    Past Medical History:  Past Medical History  Diagnosis Date  . Anxiety    History reviewed. No pertinent past surgical history. Family History: No family history on file. Family Psychiatric  History: Denies that being any family history of mental illness or substance abuse problems Social History:  History  Alcohol Use No     History  Drug Use No    Social History   Social History  . Marital Status: Single    Spouse Name: N/A  . Number of Children: N/A  . Years of Education: N/A   Social History Main Topics  . Smoking status: Current Every Day Smoker -- 1.50 packs/day  . Smokeless tobacco: Never Used  . Alcohol Use: No  . Drug Use: No  . Sexual Activity: Not Asked   Other Topics Concern  . None   Social History Narrative   Additional Social History:    Allergies:  No Known Allergies  Labs:  Results for orders placed or performed during the hospital encounter of 09/10/15 (from the past 48 hour(s))  Comprehensive metabolic panel     Status: Abnormal   Collection Time: 09/10/15 11:37 AM  Result Value Ref Range   Sodium 141 135 - 145 mmol/L   Potassium 4.3 3.5 - 5.1 mmol/L   Chloride 106 101 - 111 mmol/L   CO2 30 22 - 32 mmol/L   Glucose, Bld 107 (H) 65 - 99 mg/dL   BUN 6 6 - 20 mg/dL   Creatinine, Ser 0.77 0.61  - 1.24 mg/dL   Calcium 9.5 8.9 - 10.3 mg/dL   Total Protein 7.1 6.5 - 8.1 g/dL   Albumin 4.3 3.5 - 5.0 g/dL   AST 18 15 - 41 U/L   ALT 17 17 - 63 U/L   Alkaline Phosphatase 92 38 - 126 U/L   Total Bilirubin 0.4 0.3 - 1.2 mg/dL   GFR calc non Af Amer >60 >60 mL/min   GFR calc Af Amer >60 >60 mL/min    Comment: (NOTE) The eGFR has been calculated using the CKD EPI equation. This calculation has not been validated in all clinical situations. eGFR's persistently <60 mL/min signify possible Chronic Kidney Disease.    Anion gap 5 5 - 15  Ethanol     Status: None   Collection Time: 09/10/15 11:37 AM  Result Value Ref Range   Alcohol, Ethyl (B) <5 <5 mg/dL    Comment:        LOWEST DETECTABLE LIMIT FOR SERUM ALCOHOL IS 5 mg/dL FOR MEDICAL PURPOSES ONLY   cbc     Status: None   Collection Time: 09/10/15 11:37 AM  Result Value Ref Range   WBC 6.2 3.8 - 10.6 K/uL   RBC 4.79 4.40 - 5.90 MIL/uL   Hemoglobin 14.4 13.0 - 18.0 g/dL   HCT 43.3 40.0 - 52.0 %   MCV 90.4 80.0 - 100.0 fL   MCH 30.1 26.0 - 34.0 pg   MCHC 33.3 32.0 - 36.0 g/dL   RDW 13.7 11.5 - 14.5 %   Platelets 164 150 - 440 K/uL  Urine Drug Screen, Qualitative     Status: Abnormal   Collection Time: 09/10/15 11:45 AM  Result Value Ref Range   Tricyclic, Ur Screen NONE DETECTED NONE DETECTED   Amphetamines, Ur Screen NONE DETECTED NONE DETECTED   MDMA (Ecstasy)Ur Screen NONE DETECTED NONE DETECTED   Cocaine Metabolite,Ur [place] NONE DETECTED NONE DETECTED   Opiate, Ur Screen NONE DETECTED NONE DETECTED   Phencyclidine (PCP) Ur S NONE DETECTED NONE DETECTED   Cannabinoid 50 Ng, Ur [place] POSITIVE (A) NONE DETECTED   Barbiturates, Ur Screen NONE DETECTED NONE DETECTED   Benzodiazepine, Ur Scrn POSITIVE (A) NONE DETECTED   Methadone Scn, Ur NONE DETECTED NONE DETECTED    Comment: (NOTE) 885  Tricyclics, urine               Cutoff 1000 ng/mL 200  Amphetamines, urine             Cutoff 1000 ng/mL 300  MDMA (Ecstasy), urine            Cutoff 500 ng/mL 400  Cocaine Metabolite, urine       Cutoff 300 ng/mL 500  Opiate, urine                   Cutoff 300 ng/mL 600  Phencyclidine (PCP), urine  Cutoff 25 ng/mL 700  Cannabinoid, urine              Cutoff 50 ng/mL 800  Barbiturates, urine             Cutoff 200 ng/mL 900  Benzodiazepine, urine           Cutoff 200 ng/mL 1000 Methadone, urine                Cutoff 300 ng/mL 1100 1200 The urine drug screen provides only a preliminary, unconfirmed 1300 analytical test result and should not be used for non-medical 1400 purposes. Clinical consideration and professional judgment should 1500 be applied to any positive drug screen result due to possible 1600 interfering substances. A more specific alternate chemical method 1700 must be used in order to obtain a confirmed analytical result.  1800 Gas chromato graphy / mass spectrometry (GC/MS) is the preferred 1900 confirmatory method.     No current facility-administered medications for this encounter.   Current Outpatient Prescriptions  Medication Sig Dispense Refill  . ALPRAZolam (XANAX) 0.25 MG tablet Take 2 mg by mouth 3 (three) times daily.    Marland Kitchen buPROPion (WELLBUTRIN XL) 300 MG 24 hr tablet Take 300 mg by mouth daily.      Musculoskeletal: Strength & Muscle Tone: within normal limits Gait & Station: normal Patient leans: N/A  Psychiatric Specialty Exam: Physical Exam  Nursing note and vitals reviewed. Constitutional: He appears well-developed and well-nourished.  HENT:  Head: Normocephalic and atraumatic.  Eyes: Conjunctivae are normal. Pupils are equal, round, and reactive to light.  Neck: Normal range of motion.  Cardiovascular: Regular rhythm and normal heart sounds.   Respiratory: Effort normal. No respiratory distress.  GI: Soft.  Musculoskeletal: Normal range of motion.  Neurological: He is alert.  Skin: Skin is warm and dry.  Psychiatric: He has a normal mood and affect. His behavior is normal.  Judgment and thought content normal.    Review of Systems  Constitutional: Negative.   HENT: Negative.   Eyes: Negative.   Respiratory: Negative.   Cardiovascular: Negative.   Gastrointestinal: Negative.   Musculoskeletal: Negative.   Skin: Negative.   Neurological: Negative.   Psychiatric/Behavioral: Negative for depression, suicidal ideas, hallucinations, memory loss and substance abuse. The patient does not have insomnia.     Blood pressure 141/82, pulse 73, temperature 98.7 F (37.1 C), temperature source Oral, resp. rate 20, height 6' 1" (1.854 m), weight 68.04 kg (150 lb), SpO2 98 %.Body mass index is 19.79 kg/(m^2).  General Appearance: Casual  Eye Contact:  Good  Speech:  Normal Rate  Volume:  Normal  Mood:  Euthymic  Affect:  Constricted  Thought Process:  Goal Directed  Orientation:  Full (Time, Place, and Person)  Thought Content:  Logical  Suicidal Thoughts:  No  Homicidal Thoughts:  No  Memory:  Immediate;   Good Recent;   Good Remote;   Fair  Judgement:  Fair  Insight:  Fair  Psychomotor Activity:  Decreased  Concentration:  Concentration: Fair  Recall:  [employer] of Knowledge:  Fair  Language:  Fair  Akathisia:  No  Handed:  Right  AIMS (if indicated):     Assets:  Communication Skills Housing Physical Health  ADL's:  Intact  Cognition:  WNL  Sleep:        Treatment Plan Summary: Plan 25 year old man brought in under IVC filed by his family. They are alleging a pattern of extensive substance abuse and feel that  it is life or death. Patient denies every bit of this. He does deny abusing drugs but there is evidence that he has been using marijuana but no evidence of any other drugs including no evidence of heroin or amphetamines. It is possible that the patient may be lying and may be having more of a substance abuse problem that he is willing to admit that I have no direct proof of it at this time. He is calm and cooperative and completely denies any  suicidal thoughts and appears lucid in his thinking. Patient does not meet commitment criteria. She will be discharged from IVC and can be discharged from the emergency room. Follow-up will be through giving him information about possible treatment at [facility]. Case reviewed with emergency room physician.  Disposition: Patient does not meet criteria for psychiatric inpatient admission. Supportive therapy provided about ongoing stressors.  John Clapacs, MD 09/10/2015 3:52 PM  

## 2015-09-12 ENCOUNTER — Ambulatory Visit: Payer: BLUE CROSS/BLUE SHIELD

## 2015-12-04 ENCOUNTER — Emergency Department: Payer: BLUE CROSS/BLUE SHIELD

## 2015-12-04 ENCOUNTER — Emergency Department
Admission: EM | Admit: 2015-12-04 | Discharge: 2015-12-04 | Disposition: A | Discharge status: Home / Self Care | Payer: BLUE CROSS/BLUE SHIELD | Attending: Emergency Medicine | Admitting: Emergency Medicine

## 2015-12-04 ENCOUNTER — Encounter: Payer: Self-pay | Admitting: Medical Oncology

## 2015-12-04 DIAGNOSIS — Y929 Unspecified place or not applicable: Secondary | ICD-10-CM | POA: Diagnosis not present

## 2015-12-04 DIAGNOSIS — Y999 Unspecified external cause status: Secondary | ICD-10-CM | POA: Insufficient documentation

## 2015-12-04 DIAGNOSIS — F17200 Nicotine dependence, unspecified, uncomplicated: Secondary | ICD-10-CM | POA: Insufficient documentation

## 2015-12-04 DIAGNOSIS — Y939 Activity, unspecified: Secondary | ICD-10-CM | POA: Diagnosis not present

## 2015-12-04 DIAGNOSIS — S8992XA Unspecified injury of left lower leg, initial encounter: Secondary | ICD-10-CM | POA: Diagnosis present

## 2015-12-04 DIAGNOSIS — S82142A Displaced bicondylar fracture of left tibia, initial encounter for closed fracture: Secondary | ICD-10-CM | POA: Insufficient documentation

## 2015-12-04 DIAGNOSIS — M25462 Effusion, left knee: Secondary | ICD-10-CM

## 2015-12-04 DIAGNOSIS — Z79899 Other long term (current) drug therapy: Secondary | ICD-10-CM | POA: Diagnosis not present

## 2015-12-04 MED ORDER — NAPROXEN 500 MG PO TBEC: 500.0000 mg | DELAYED_RELEASE_TABLET | Freq: Two times a day (BID) | ORAL | 0 refills | Status: DC

## 2015-12-04 MED ORDER — HYDROCODONE-ACETAMINOPHEN 5-325 MG PO TABS: 1.0000 | ORAL_TABLET | Freq: Four times a day (QID) | ORAL | 0 refills | Status: AC | PRN
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
## 2015-12-04 NOTE — Discharge Instructions (Signed)
Keep the leg elevated when seated. Apply ice over the splint for 20 minutes at a time. Take the prescription meds as directed. Follow-up with Dr. Hyacinth MeekerMiller or your ortho provider in the next week for further fracture care.

## 2015-12-04 NOTE — ED Notes (Addendum)
Pt alert and oriented X4, active, cooperative, pt in NAD. RR even and unlabored, color WNL.  Pt informed to return if any life threatening symptoms occur.  Pt requesting to leave before repeat VS obtained. Pt left with mother.

## 2016-02-28 ENCOUNTER — Other Ambulatory Visit: Payer: Self-pay | Admitting: Specialist

## 2016-02-28 DIAGNOSIS — S82125D Nondisplaced fracture of lateral condyle of left tibia, subsequent encounter for closed fracture with routine healing: Secondary | ICD-10-CM

## 2016-03-10 ENCOUNTER — Ambulatory Visit: Payer: BLUE CROSS/BLUE SHIELD

## 2016-04-10 ENCOUNTER — Other Ambulatory Visit: Payer: Self-pay | Admitting: Specialist

## 2016-04-10 DIAGNOSIS — S82125D Nondisplaced fracture of lateral condyle of left tibia, subsequent encounter for closed fracture with routine healing: Secondary | ICD-10-CM

## 2016-04-18 ENCOUNTER — Ambulatory Visit: Payer: BLUE CROSS/BLUE SHIELD

## 2016-04-28 ENCOUNTER — Ambulatory Visit
Admission: RE | Admit: 2016-04-28 | Discharge: 2016-04-28 | Disposition: A | Discharge status: Home / Self Care | Payer: BLUE CROSS/BLUE SHIELD | Source: Ambulatory Visit | Attending: Specialist | Admitting: Specialist

## 2016-04-28 DIAGNOSIS — M25462 Effusion, left knee: Secondary | ICD-10-CM | POA: Insufficient documentation

## 2016-04-28 DIAGNOSIS — S82125D Nondisplaced fracture of lateral condyle of left tibia, subsequent encounter for closed fracture with routine healing: Secondary | ICD-10-CM

## 2016-04-28 DIAGNOSIS — X58XXXD Exposure to other specified factors, subsequent encounter: Secondary | ICD-10-CM | POA: Diagnosis not present

## 2016-04-28 DIAGNOSIS — S83242A Other tear of medial meniscus, current injury, left knee, initial encounter: Secondary | ICD-10-CM | POA: Insufficient documentation

## 2016-04-28 DIAGNOSIS — S82142D Displaced bicondylar fracture of left tibia, subsequent encounter for closed fracture with routine healing: Secondary | ICD-10-CM | POA: Diagnosis present

## 2016-04-28 DIAGNOSIS — R600 Localized edema: Secondary | ICD-10-CM | POA: Insufficient documentation

## 2016-05-05 ENCOUNTER — Other Ambulatory Visit: Payer: Self-pay | Admitting: Specialist

## 2016-05-06 ENCOUNTER — Encounter
Admission: RE | Admit: 2016-05-06 | Discharge: 2016-05-06 | Disposition: A | Discharge status: Home / Self Care | Payer: No Typology Code available for payment source | Source: Ambulatory Visit | Attending: Specialist | Admitting: Specialist

## 2016-05-06 HISTORY — DX: Bipolar disorder, unspecified: F31.9

## 2016-05-06 HISTORY — DX: Other psychoactive substance abuse, uncomplicated: F19.10

## 2016-05-06 NOTE — Patient Instructions (Signed)
  Your procedure is scheduled on: 05-12-16 Report to Same Day Surgery 2nd floor medical mall [facility](Medical Mall Entrance-take elevator on left to 2nd floor.  Check in with surgery information desk.) To find out your arrival time please call [phone] between 1PM - 3PM on 05-09-16  Remember: Instructions that are not followed completely may result in serious medical risk, up to and including death, or upon the discretion of your surgeon and anesthesiologist your surgery may need to be rescheduled.    _x___ 1. Do not eat food or drink liquids after midnight. No gum chewing or hard candies.     __x__ 2. No Alcohol for 24 hours before or after surgery.   __x__3. No Smoking for 24 prior to surgery.   ____  4. Bring all medications with you on the day of surgery if instructed.    __x__ 5. Notify your doctor if there is any change in your medical condition     (cold, fever, infections).     Do not wear jewelry, make-up, hairpins, clips or nail polish.  Do not wear lotions, powders, or perfumes. You may wear deodorant.  Do not shave 48 hours prior to surgery. Men may shave face and neck.  Do not bring valuables to the hospital.    [facility] is not responsible for any belongings or valuables.               Contacts, dentures or bridgework may not be worn into surgery.  Leave your suitcase in the car. After surgery it may be brought to your room.  For patients admitted to the hospital, discharge time is determined by your treatment team.   Patients discharged the day of surgery will not be allowed to drive home.  You will need someone to drive you home and stay with you the night of your procedure.    Please read over the following fact sheets that you were given:   [facility] Preparing for Surgery and or MRSA Information   _x___ Take these medicines the morning of surgery with A SIP OF WATER:    1. XANAX  2.  3.  4.  5.  6.  ____Fleets enema or Magnesium Citrate as directed.   ____  Use CHG Soap or sage wipes as directed on instruction sheet   ____ Use inhalers on the day of surgery and bring to hospital day of surgery  ____ Stop metformin 2 days prior to surgery    ____ Take 1/2 of usual insulin dose the night before surgery and none on the morning of  surgery.   ____ Stop Aspirin, Coumadin, Pllavix ,Eliquis, Effient, or Pradaxa  x__ Stop Anti-inflammatories such as Advil, Aleve, Ibuprofen, Motrin, Naproxen,          Naprosyn, Goodies powders or aspirin products NOW-Ok to take Tylenol OR TRAMADOL   ____ Stop supplements until after surgery.    ____ Bring C-Pap to the hospital.

## 2016-05-09 ENCOUNTER — Encounter: Payer: Self-pay | Admitting: *Deleted

## 2016-05-11 MED ORDER — CLINDAMYCIN PHOSPHATE 600 MG/50ML IV SOLN: 600.0000 mg | INTRAVENOUS | Status: DC

## 2016-05-11 MED ORDER — CEFAZOLIN SODIUM-DEXTROSE 2-4 GM/100ML-% IV SOLN
2.0000 g | INTRAVENOUS | Status: AC
  Administered 2016-05-12: 2 g via INTRAVENOUS

## 2016-05-12 ENCOUNTER — Encounter: Payer: Self-pay | Admitting: *Deleted

## 2016-05-12 ENCOUNTER — Ambulatory Visit: Payer: BLUE CROSS/BLUE SHIELD | Admitting: Certified Registered"

## 2016-05-12 ENCOUNTER — Ambulatory Visit
Admission: RE | Admit: 2016-05-12 | Discharge: 2016-05-12 | Disposition: A | Discharge status: Home / Self Care | Payer: BLUE CROSS/BLUE SHIELD | Source: Ambulatory Visit | Attending: Specialist | Admitting: Specialist

## 2016-05-12 ENCOUNTER — Encounter
Admission: RE | Disposition: A | Discharge status: Home / Self Care | Payer: Self-pay | Source: Ambulatory Visit | Attending: Specialist

## 2016-05-12 DIAGNOSIS — F319 Bipolar disorder, unspecified: Secondary | ICD-10-CM | POA: Diagnosis not present

## 2016-05-12 DIAGNOSIS — M65862 Other synovitis and tenosynovitis, left lower leg: Secondary | ICD-10-CM | POA: Diagnosis not present

## 2016-05-12 DIAGNOSIS — M2342 Loose body in knee, left knee: Secondary | ICD-10-CM | POA: Insufficient documentation

## 2016-05-12 DIAGNOSIS — Z720 Tobacco use: Secondary | ICD-10-CM | POA: Diagnosis not present

## 2016-05-12 DIAGNOSIS — M23222 Derangement of posterior horn of medial meniscus due to old tear or injury, left knee: Secondary | ICD-10-CM | POA: Diagnosis present

## 2016-05-12 DIAGNOSIS — F419 Anxiety disorder, unspecified: Secondary | ICD-10-CM | POA: Diagnosis not present

## 2016-05-12 DIAGNOSIS — F1290 Cannabis use, unspecified, uncomplicated: Secondary | ICD-10-CM | POA: Diagnosis not present

## 2016-05-12 HISTORY — PX: KNEE ARTHROSCOPY: SHX127

## 2016-05-12 LAB — URINE DRUG SCREEN, QUALITATIVE (ARMC ONLY)
Amphetamines, Ur Screen: NOT DETECTED
Barbiturates, Ur Screen: NOT DETECTED
Benzodiazepine, Ur Scrn: POSITIVE — AB
CANNABINOID 50 NG, UR ~~LOC~~: POSITIVE — AB
COCAINE METABOLITE, UR ~~LOC~~: NOT DETECTED
MDMA (ECSTASY) UR SCREEN: NOT DETECTED
Methadone Scn, Ur: NOT DETECTED
OPIATE, UR SCREEN: POSITIVE — AB
PHENCYCLIDINE (PCP) UR S: NOT DETECTED
Tricyclic, Ur Screen: NOT DETECTED

## 2016-05-12 LAB — SURGICAL PCR SCREEN
MRSA, PCR: NEGATIVE
Staphylococcus aureus: POSITIVE — AB

## 2016-05-12 SURGERY — ARTHROSCOPY, KNEE
Anesthesia: General | Site: Knee | Laterality: Left | Wound class: Clean

## 2016-05-12 MED ORDER — MELOXICAM 7.5 MG PO TABS
ORAL_TABLET | ORAL | Status: AC
  Filled 2016-05-12: qty 2

## 2016-05-12 MED ORDER — MORPHINE SULFATE (PF) 4 MG/ML IV SOLN
INTRAVENOUS | Status: AC
  Filled 2016-05-12: qty 1

## 2016-05-12 MED ORDER — MIDAZOLAM HCL 2 MG/2ML IJ SOLN
1.0000 mg | Freq: Once | INTRAMUSCULAR | Status: AC
  Administered 2016-05-12: 1 mg via INTRAVENOUS

## 2016-05-12 MED ORDER — LIDOCAINE 2% (20 MG/ML) 5 ML SYRINGE
INTRAMUSCULAR | Status: AC
  Filled 2016-05-12: qty 5

## 2016-05-12 MED ORDER — GABAPENTIN 400 MG PO CAPS
ORAL_CAPSULE | ORAL | Status: AC
  Filled 2016-05-12: qty 1

## 2016-05-12 MED ORDER — CLINDAMYCIN PHOSPHATE 600 MG/50ML IV SOLN
INTRAVENOUS | Status: AC
  Filled 2016-05-12: qty 50

## 2016-05-12 MED ORDER — LIDOCAINE HCL (CARDIAC) 20 MG/ML IV SOLN
INTRAVENOUS | Status: DC | PRN
  Administered 2016-05-12: 100 mg via INTRAVENOUS

## 2016-05-12 MED ORDER — GABAPENTIN 400 MG PO CAPS
400.0000 mg | ORAL_CAPSULE | Freq: Once | ORAL | Status: AC
  Administered 2016-05-12: 400 mg via ORAL

## 2016-05-12 MED ORDER — BUPIVACAINE-EPINEPHRINE (PF) 0.5% -1:200000 IJ SOLN
INTRAMUSCULAR | Status: AC
  Filled 2016-05-12: qty 30

## 2016-05-12 MED ORDER — FENTANYL CITRATE (PF) 100 MCG/2ML IJ SOLN
INTRAMUSCULAR | Status: AC
  Filled 2016-05-12: qty 2

## 2016-05-12 MED ORDER — GABAPENTIN 400 MG PO CAPS: 400.0000 mg | ORAL_CAPSULE | Freq: Three times a day (TID) | ORAL | 3 refills | Status: AC

## 2016-05-12 MED ORDER — MELOXICAM 7.5 MG PO TABS
15.0000 mg | ORAL_TABLET | Freq: Once | ORAL | Status: AC
  Administered 2016-05-12: 15 mg via ORAL

## 2016-05-12 MED ORDER — MIDAZOLAM HCL 2 MG/2ML IJ SOLN
INTRAMUSCULAR | Status: AC
  Filled 2016-05-12: qty 2

## 2016-05-12 MED ORDER — BUPIVACAINE-EPINEPHRINE (PF) 0.25% -1:200000 IJ SOLN
INTRAMUSCULAR | Status: AC
  Filled 2016-05-12: qty 30

## 2016-05-12 MED ORDER — PROPOFOL 10 MG/ML IV BOLUS
INTRAVENOUS | Status: DC | PRN
  Administered 2016-05-12: 200 mg via INTRAVENOUS

## 2016-05-12 MED ORDER — CHLORHEXIDINE GLUCONATE CLOTH 2 % EX PADS: 6.0000 | MEDICATED_PAD | Freq: Once | CUTANEOUS | Status: DC

## 2016-05-12 MED ORDER — HYDROCODONE-ACETAMINOPHEN 5-325 MG PO TABS
ORAL_TABLET | ORAL | Status: AC
  Filled 2016-05-12: qty 1

## 2016-05-12 MED ORDER — FENTANYL CITRATE (PF) 100 MCG/2ML IJ SOLN
INTRAMUSCULAR | Status: DC | PRN
  Administered 2016-05-12: 50 ug via INTRAVENOUS
  Administered 2016-05-12: 25 ug via INTRAVENOUS

## 2016-05-12 MED ORDER — CEFAZOLIN SODIUM-DEXTROSE 2-4 GM/100ML-% IV SOLN
INTRAVENOUS | Status: AC
  Filled 2016-05-12: qty 100

## 2016-05-12 MED ORDER — FENTANYL CITRATE (PF) 100 MCG/2ML IJ SOLN
25.0000 ug | INTRAMUSCULAR | Status: DC | PRN
  Administered 2016-05-12 (×4): 25 ug via INTRAVENOUS

## 2016-05-12 MED ORDER — MORPHINE SULFATE (PF) 4 MG/ML IV SOLN
INTRAVENOUS | Status: DC | PRN
  Administered 2016-05-12: 4 mg

## 2016-05-12 MED ORDER — ONDANSETRON HCL 4 MG/2ML IJ SOLN: 4.0000 mg | Freq: Once | INTRAMUSCULAR | Status: DC | PRN

## 2016-05-12 MED ORDER — FAMOTIDINE 20 MG PO TABS
20.0000 mg | ORAL_TABLET | Freq: Once | ORAL | Status: AC
Start: 2016-05-12 — End: 2016-05-12
  Administered 2016-05-12: 20 mg via ORAL

## 2016-05-12 MED ORDER — HYDROCODONE-ACETAMINOPHEN 5-325 MG PO TABS: 1.0000 | ORAL_TABLET | Freq: Four times a day (QID) | ORAL | 0 refills | Status: AC | PRN

## 2016-05-12 MED ORDER — SODIUM CHLORIDE 0.9 % IV SOLN: 1.0000 mg/h | Freq: Once | INTRAVENOUS | Status: DC

## 2016-05-12 MED ORDER — ONDANSETRON HCL 4 MG/2ML IJ SOLN
INTRAMUSCULAR | Status: DC | PRN
  Administered 2016-05-12: 4 mg via INTRAVENOUS

## 2016-05-12 MED ORDER — HYDROCODONE-ACETAMINOPHEN 5-325 MG PO TABS
1.0000 | ORAL_TABLET | Freq: Four times a day (QID) | ORAL | Status: DC | PRN
  Administered 2016-05-12: 1 via ORAL

## 2016-05-12 MED ORDER — LACTATED RINGERS IV SOLN
INTRAVENOUS | Status: DC
  Administered 2016-05-12: 13:00:00 via INTRAVENOUS

## 2016-05-12 MED ORDER — MIDAZOLAM HCL 2 MG/2ML IJ SOLN
INTRAMUSCULAR | Status: DC | PRN
  Administered 2016-05-12 (×2): 2 mg via INTRAVENOUS

## 2016-05-12 MED ORDER — BUPIVACAINE-EPINEPHRINE (PF) 0.5% -1:200000 IJ SOLN
INTRAMUSCULAR | Status: DC | PRN
Start: 2016-05-12 — End: 2016-05-12
  Administered 2016-05-12: 60 mL

## 2016-05-12 MED ORDER — MELOXICAM 15 MG PO TABS: 15.0000 mg | ORAL_TABLET | Freq: Every day | ORAL | 3 refills | Status: AC

## 2016-05-12 MED ORDER — FAMOTIDINE 20 MG PO TABS
ORAL_TABLET | ORAL | Status: AC
  Filled 2016-05-12: qty 1

## 2016-05-12 MED ORDER — PROPOFOL 10 MG/ML IV BOLUS
INTRAVENOUS | Status: AC
  Filled 2016-05-12: qty 20

## 2016-05-12 MED ORDER — ONDANSETRON HCL 4 MG/2ML IJ SOLN
INTRAMUSCULAR | Status: AC
  Filled 2016-05-12: qty 2

## 2016-05-12 SURGICAL SUPPLY — 30 items
BAG COUNTER SPONGE EZ (MISCELLANEOUS) IMPLANT
BLADE AGGRESSIVE PLUS 4.0 (BLADE) IMPLANT
BUR RADIUS 4.0X18.5 (BURR) ×3 IMPLANT
CHLORAPREP W/TINT 26ML (MISCELLANEOUS) ×3 IMPLANT
COUNTER SPONGE BAG EZ (MISCELLANEOUS)
CUFF TOURN 24 STER (MISCELLANEOUS) IMPLANT
CUFF TOURN 30 STER DUAL PORT (MISCELLANEOUS) ×3 IMPLANT
CUTTER SLOTTED WHISKER 4.0 (BURR) IMPLANT
FIXAT FASTFIX LOAD NON ABS (MISCELLANEOUS) ×3 IMPLANT
FIXAT NDL DEL SYS CVD (MISCELLANEOUS) ×6
GAUZE SPONGE 4X4 12PLY STRL (GAUZE/BANDAGES/DRESSINGS) ×3 IMPLANT
GLOVE BIO SURGEON STRL SZ8 (GLOVE) ×3 IMPLANT
GOWN STRL REUS W/ TWL LRG LVL3 (GOWN DISPOSABLE) ×2 IMPLANT
GOWN STRL REUS W/TWL LRG LVL3 (GOWN DISPOSABLE) ×4
GOWN STRL REUS W/TWL LRG LVL4 (GOWN DISPOSABLE) ×3 IMPLANT
IV LACTATED RINGER IRRG 3000ML (IV SOLUTION) ×12
IV LR IRRIG 3000ML ARTHROMATIC (IV SOLUTION) ×6 IMPLANT
KIT RM TURNOVER STRD PROC AR (KITS) ×3 IMPLANT
MANIFOLD NEPTUNE II (INSTRUMENTS) ×3 IMPLANT
NDL SAFETY 18GX1.5 (NEEDLE) ×6 IMPLANT
NEEDLE CRVD FSTFX SPLT CANNULA (MISCELLANEOUS) ×2 IMPLANT
PACK ARTHROSCOPY KNEE (MISCELLANEOUS) ×3 IMPLANT
PUSHER KNOT CVD SUT CUTTER (INSTRUMENTS) ×3 IMPLANT
SET TUBE SUCT SHAVER OUTFL 24K (TUBING) ×3 IMPLANT
SOL PREP PVP 2OZ (MISCELLANEOUS) ×3
SOLUTION PREP PVP 2OZ (MISCELLANEOUS) ×1 IMPLANT
SUT ETHILON 3 0 FSLX (SUTURE) ×3 IMPLANT
SYR 30ML LL (SYRINGE) ×6 IMPLANT
TUBING ARTHRO INFLOW-ONLY STRL (TUBING) ×3 IMPLANT
WAND HAND CNTRL MULTIVAC 50 (MISCELLANEOUS) ×3 IMPLANT

## 2016-05-12 NOTE — OR Nursing (Signed)
UDS positive for benzo, opiod, cannaboid - Dr. Noralyn Pickarroll aware of same. Pt advised on arrival he was MRSA positive years back in a wound - PCR swab sent to lab  1210 pm

## 2016-05-12 NOTE — H&P (Signed)
THE PATIENT WAS SEEN PRIOR TO SURGERY TODAY.  HISTORY, ALLERGIES, HOME MEDICATIONS AND OPERATIVE PROCEDURE WERE REVIEWED. RISKS AND BENEFITS OF SURGERY DISCUSSED WITH PATIENT AGAIN.  NO CHANGES FROM INITIAL HISTORY AND PHYSICAL NOTED.    

## 2016-05-12 NOTE — Anesthesia Preprocedure Evaluation (Signed)
Anesthesia Evaluation  Patient identified by MRN, date of birth, ID band Patient awake    Reviewed: Allergy & Precautions, NPO status , Patient's Chart, lab work & pertinent test results  Airway Mallampati: II  TM Distance: >3 FB     Dental  (+) Chipped, Caps   Pulmonary Current Smoker,    Pulmonary exam normal        Cardiovascular negative cardio ROS Normal cardiovascular exam     Neuro/Psych PSYCHIATRIC DISORDERS Anxiety Bipolar Disorder    GI/Hepatic negative GI ROS, Neg liver ROS, (+)     substance abuse  marijuana use,   Endo/Other  negative endocrine ROS  Renal/GU negative Renal ROS  negative genitourinary   Musculoskeletal   Abdominal Normal abdominal exam  (+)   Peds negative pediatric ROS (+)  Hematology negative hematology ROS (+)   Anesthesia Other Findings Past Medical History: No date: Anxiety No date: Bipolar 1 disorder (HCC) No date: Substance abuse  Reproductive/Obstetrics                             Anesthesia Physical Anesthesia Plan  ASA: II  Anesthesia Plan: General   Post-op Pain Management:    Induction: Intravenous  Airway Management Planned: LMA  Additional Equipment:   Intra-op Plan:   Post-operative Plan: Extubation in OR  Informed Consent: I have reviewed the patients History and Physical, chart, labs and discussed the procedure including the risks, benefits and alternatives for the proposed anesthesia with the patient or authorized representative who has indicated his/her understanding and acceptance.   Dental advisory given  Plan Discussed with: CRNA and Surgeon  Anesthesia Plan Comments:         Anesthesia Quick Evaluation

## 2016-05-12 NOTE — Transfer of Care (Signed)
Immediate Anesthesia Transfer of Care Note  Patient: Benjamin Tyler  Procedure(s) Performed: Procedure(s): ARTHROSCOPY KNEE (Left)  Patient Location: PACU  Anesthesia Type:General  Level of Consciousness: sedated  Airway & Oxygen Therapy: Patient Spontanous Breathing and Patient connected to face mask oxygen  Post-op Assessment: Report given to RN and Post -op Vital signs reviewed and stable  Post vital signs: Reviewed and stable  Last Vitals:  Vitals:   05/12/16 1145 05/12/16 1452  BP: 122/86 115/64  Pulse: 97 68  Resp: 18 11  Temp: 36.9 C     Last Pain:  Vitals:   05/12/16 1145  TempSrc: Tympanic  PainSc: 10-Worst pain ever         Complications: No apparent anesthesia complications

## 2016-05-12 NOTE — OR Nursing (Signed)
Ancef, cleocin to be sent to OR with patient

## 2016-05-12 NOTE — Progress Notes (Signed)
Capillary refill positive to left foot 

## 2016-05-12 NOTE — Discharge Instructions (Signed)

## 2016-05-12 NOTE — Anesthesia Postprocedure Evaluation (Signed)
Anesthesia Post Note  Patient: Benjamin Tyler  Procedure(s) Performed: Procedure(s) (LRB): ARTHROSCOPY KNEE (Left)  Patient location during evaluation: PACU Anesthesia Type: General Level of consciousness: awake and alert and oriented Pain management: pain level controlled Vital Signs Assessment: post-procedure vital signs reviewed and stable Respiratory status: spontaneous breathing Cardiovascular status: blood pressure returned to baseline Anesthetic complications: no     Last Vitals:  Vitals:   05/12/16 1452 05/12/16 1505  BP: 115/64 110/70  Pulse: 68 61  Resp: 11 11  Temp:      Last Pain:  Vitals:   05/12/16 1505  TempSrc:   PainSc: Asleep                 Marieke Lubke

## 2016-05-12 NOTE — Anesthesia Post-op Follow-up Note (Cosign Needed)
Anesthesia QCDR form completed.        

## 2016-05-12 NOTE — Anesthesia Procedure Notes (Signed)
Procedure Name: LMA Insertion Performed by: Murdis Flitton Pre-anesthesia Checklist: Patient identified, Patient being monitored, Timeout performed, Emergency Drugs available and Suction available Patient Re-evaluated:Patient Re-evaluated prior to inductionOxygen Delivery Method: Circle system utilized Preoxygenation: Pre-oxygenation with 100% oxygen Intubation Type: IV induction LMA: LMA inserted LMA Size: 4.0 Tube type: Oral Number of attempts: 1 Placement Confirmation: positive ETCO2 and breath sounds checked- equal and bilateral Tube secured with: Tape Dental Injury: Teeth and Oropharynx as per pre-operative assessment        

## 2016-05-12 NOTE — Op Note (Signed)
05/12/2016  2:49 PM  PATIENT:  Benjamin Tyler    PRE-OPERATIVE DIAGNOSIS:  M23.222 Derang of post horn of medial mensc d/t old tear/inj, l knee  POST-OPERATIVE DIAGNOSIS:  Same  PROCEDURE:  ARTHROSCOPY LEFT KNEE WITH MEDIAL MENISCUS REPAIR  SURGEON:  Donney Caraveo E, MD  COMPLICATIONS:   NONE  EBL:  MINIMAL  TOURNIQUET TIME:   NONE     .  ANESTHESIA:  GENERAL  PREOPERATIVE INDICATIONS:  Benjamin FranklinJustin D Tosh is a  26 y.o. male with a diagnosis of M23.222 Derang of post horn of medial mensc d/t old tear/inj, l knee who failed conservative measures and elected for surgical management.    The risks benefits and alternatives were discussed with the patient preoperatively including but not limited to the risks of infection, bleeding, nerve injury, cardiopulmonary complications, the need for revision surgery, among others, and the patient was willing to proceed.  OPERATIVE IMPLANTS: 1 ULTRA FIX ANCHOR  OPERATIVE FINDINGS: Undersurface partial tear posterior medial meniscus.  Anterior synovitis.  Healed lateral tibial plateau fracture in good position.  One very small loose body.  OPERATIVE PROCEDURE: The patient was brought to the operating room and underwent satisfactory general LMA anesthesia in the supine position.  The leg was prepped and draped in a sterile fashion.  Arthroscopy was carried out through standard portals.  The above findings were encountered upon arthroscopy.  The suprapatellar region was normal.  The gutters were normal.  The intercondylar notch showed a normal anterior cruciate ligament and PCL.  There was anterior synovitis was was resected for visualization.  The lateral compartment showed healed plateau fracture.  The meniscus was intact.  The anterior portion of meniscus did not appear to be damaged.  The posterior horn of the medial meniscus was probed and there was a partial undersurface tear.  This was debrided with a motorized resector.  An Ultrafix anchor was  introduced to stabilize this as well.     Once this was completed and stabilized the joint was thoroughly irrigated.  Instruments were removed and knee wounds closed with 3-0 nylon.  Sponge and needle counts were correct. A dry sterile dressing was applied.  Patient was awakened and taken to recovery in good condition.   Valinda HoarHoward E Eniola Cerullo, MD

## 2016-05-13 ENCOUNTER — Encounter: Payer: Self-pay | Admitting: Specialist

## 2017-03-09 ENCOUNTER — Ambulatory Visit: Payer: Self-pay | Admitting: Family Medicine

## 2017-11-02 ENCOUNTER — Other Ambulatory Visit: Payer: Self-pay

## 2017-11-02 ENCOUNTER — Emergency Department
Admission: EM | Admit: 2017-11-02 | Discharge: 2017-11-02 | Discharge status: Against Medical Advice | Payer: BLUE CROSS/BLUE SHIELD | Attending: Emergency Medicine | Admitting: Emergency Medicine

## 2017-11-02 DIAGNOSIS — T402X1A Poisoning by other opioids, accidental (unintentional), initial encounter: Secondary | ICD-10-CM | POA: Insufficient documentation

## 2017-11-02 DIAGNOSIS — F17210 Nicotine dependence, cigarettes, uncomplicated: Secondary | ICD-10-CM | POA: Diagnosis not present

## 2017-11-02 DIAGNOSIS — Z79899 Other long term (current) drug therapy: Secondary | ICD-10-CM | POA: Diagnosis not present

## 2017-11-02 DIAGNOSIS — R4182 Altered mental status, unspecified: Secondary | ICD-10-CM | POA: Insufficient documentation

## 2017-11-02 DIAGNOSIS — T40601A Poisoning by unspecified narcotics, accidental (unintentional), initial encounter: Secondary | ICD-10-CM

## 2017-11-02 NOTE — ED Notes (Signed)
Report to allison, rn

## 2017-11-02 NOTE — ED Notes (Signed)
md at bedside

## 2017-11-02 NOTE — ED Notes (Signed)
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
## 2017-11-02 NOTE — ED Notes (Signed)
Patient is resting comfortably at this time with no signs of distress present. Equal, unlabored rise and fall of chest noted within normal rate. VS stable. Will continue to monitor.   

## 2017-11-02 NOTE — ED Notes (Signed)
Per dr. Lamont Snowballrifenbark pt may be discharged if he can find a sober ride home. Pt notified he will need to secure a driver home. Pt verbalizes understanding. Pt attempting to call for ride. Will continue to monitor pt in room until driver present.
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
## 2017-11-02 NOTE — Discharge Instructions (Signed)
Please know that if you change your mind and would like to be evaluated your more than welcome to return to the emergency department at any point for further treatment.
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
## 2022-12-16 ENCOUNTER — Ambulatory Visit: Payer: Self-pay | Admitting: Pediatrics

## 2023-06-24 ENCOUNTER — Ambulatory Visit: Payer: BLUE CROSS/BLUE SHIELD | Admitting: Pediatrics

## 2023-06-29 ENCOUNTER — Ambulatory Visit: Payer: Self-pay

## 2023-06-29 NOTE — Telephone Encounter (Signed)
 Copied from CRM [id number]. Topic: Clinical - Red Word Triage >> Jun 29, 2023  1:21 PM [name] wrote: Red Word that prompted transfer to Nurse Triage:  New Patient - He is having stomach issues, Blood in stool, It is bright red They wanted to schedule an appointment with Dr. Olevia Perches  Chief Complaint: rectal bleeding with stools; hx constipation Symptoms: see above Frequency: comes and goes Pertinent Negatives: Patient denies cp, sob Disposition: [x] ED /[] Urgent Care (no appt availability in office) / [] Appointment(In office/virtual)/ []  [facility]/ [] Home Care/ [] Refused Recommended Disposition /[]  Mobile Bus/ []  Follow-up with PCP Additional Notes: instructed to go to ER.    Reason for Disposition  MODERATE rectal bleeding (small blood clots, passing blood without stool, or toilet water turns red)  Answer Assessment - Initial Assessment Questions 1. APPEARANCE of BLOOD: "What color is it?" "Is it passed separately, on the surface of the stool, or mixed in with the stool?"      Bright red and consistent 2. AMOUNT: "How much blood was passed?"      Blood with every stool.  3. FREQUENCY: "How many times has blood been passed with the stools?"      Unknown;  4. ONSET: "When was the blood first seen in the stools?" (Days or weeks)      months 5. DIARRHEA: "Is there also some diarrhea?" If Yes, ask: "How many diarrhea stools in the past 24 hours?"      At times 6. CONSTIPATION: "Do you have constipation?" If Yes, ask: "How bad is it?"     yes 7. RECURRENT SYMPTOMS: "Have you had blood in your stools before?" If Yes, ask: "When was the last time?" and "What happened that time?"      no 8. BLOOD THINNERS: "Do you take any blood thinners?" (e.g., Coumadin/warfarin, Pradaxa/dabigatran, aspirin)     no 9. OTHER SYMPTOMS: "Do you have any other symptoms?"  (e.g., abdomen pain, vomiting, dizziness, fever)     Abdomen pain 10. PREGNANCY: "Is there any chance you  are pregnant?" "When was your last menstrual period?"       na  Protocols used: Rectal Bleeding-A-AH

## 2023-07-15 ENCOUNTER — Ambulatory Visit (INDEPENDENT_AMBULATORY_CARE_PROVIDER_SITE_OTHER): Admitting: Nurse Practitioner

## 2023-07-15 ENCOUNTER — Encounter: Payer: Self-pay | Admitting: Nurse Practitioner

## 2023-07-15 VITALS — BP 105/76 | HR 130 | Temp 98.5°F | Resp 17 | Ht 72.87 in | Wt 175.6 lb

## 2023-07-15 DIAGNOSIS — Z538 Procedure and treatment not carried out for other reasons: Secondary | ICD-10-CM

## 2023-07-15 NOTE — Progress Notes (Deleted)
   There were no vitals taken for this visit.   Subjective:    Patient ID: Benjamin Tyler, male    DOB: Aug 22, 1990, 33 y.o.   MRN: [id number]  HPI: Benjamin Tyler is a 33 y.o. male  Chief Complaint  Patient presents with   Establish Care   Patient presents to clinic to establish care with new PCP.  Introduced to Publishing rights manager role and practice setting.  All questions answered.  Discussed provider/patient relationship and expectations.  Patient reports a history of ***. Patient denies a history of: Hypertension, Elevated Cholesterol, Diabetes, Thyroid problems, Depression, Anxiety, Neurological problems, and Abdominal problems.   Active Ambulatory Problems    Diagnosis Date Noted   Marijuana abuse 09/10/2015   Resolved Ambulatory Problems    Diagnosis Date Noted   No Resolved Ambulatory Problems   Past Medical History:  Diagnosis Date   Anxiety    Bipolar 1 disorder (HCC)    Substance abuse (HCC)    Past Surgical History:  Procedure Laterality Date   KNEE ARTHROSCOPY Left 05/12/2016   Procedure: ARTHROSCOPY KNEE;  Surgeon: Marlynn Singer, MD;  Location: [facility] ORS;  Service: Orthopedics;  Laterality: Left;   NO PAST SURGERIES     History reviewed. No pertinent family history.   Review of Systems  Per HPI unless specifically indicated above     Objective:    There were no vitals taken for this visit.  Wt Readings from Last 3 Encounters:  11/02/17 150 lb (68 kg)  05/12/16 158 lb (71.7 kg)  12/04/15 150 lb (68 kg)    Physical Exam  Results for orders placed or performed during the hospital encounter of 05/12/16  Urine Drug Screen, Qualitative ([facility] only)   Collection Time: 05/12/16 11:35 AM  Result Value Ref Range   Tricyclic, Ur Screen NONE DETECTED NONE DETECTED   Amphetamines, Ur Screen NONE DETECTED NONE DETECTED   MDMA (Ecstasy)Ur Screen NONE DETECTED NONE DETECTED   Cocaine Metabolite,Ur [place] NONE DETECTED NONE DETECTED   Opiate, Ur Screen POSITIVE (A) NONE  DETECTED   Phencyclidine (PCP) Ur S NONE DETECTED NONE DETECTED   Cannabinoid 50 Ng, Ur [place] POSITIVE (A) NONE DETECTED   Barbiturates, Ur Screen NONE DETECTED NONE DETECTED   Benzodiazepine, Ur Scrn POSITIVE (A) NONE DETECTED   Methadone Scn, Ur NONE DETECTED NONE DETECTED  Surgical pcr screen   Collection Time: 05/12/16 12:00 PM   Specimen: Nasal Mucosa; Nasal Swab  Result Value Ref Range   MRSA, PCR NEGATIVE NEGATIVE   Staphylococcus aureus POSITIVE (A) NEGATIVE      Assessment & Plan:   Problem List Items Addressed This Visit   None    Follow up plan: No follow-ups on file.

## 2023-07-15 NOTE — Progress Notes (Signed)
 Patient's insurance is out of network.  Appointment was cancelled.

## 2023-08-25 ENCOUNTER — Ambulatory Visit: Admitting: Pediatrics
